# Patient Record
Sex: Female | Born: 1960
Health system: Southern US, Community
[De-identification: ages and names within clinical notes are randomized; demographics above are authoritative.]

## PROBLEM LIST (undated history)

## (undated) DIAGNOSIS — E119 Type 2 diabetes mellitus without complications: Secondary | ICD-10-CM

## (undated) DIAGNOSIS — Z9861 Coronary angioplasty status: Secondary | ICD-10-CM

## (undated) DIAGNOSIS — I2510 Atherosclerotic heart disease of native coronary artery without angina pectoris: Secondary | ICD-10-CM

## (undated) DIAGNOSIS — F319 Bipolar disorder, unspecified: Secondary | ICD-10-CM

## (undated) DIAGNOSIS — C801 Malignant (primary) neoplasm, unspecified: Secondary | ICD-10-CM

## (undated) DIAGNOSIS — I214 Non-ST elevation (NSTEMI) myocardial infarction: Secondary | ICD-10-CM

## (undated) DIAGNOSIS — E781 Pure hyperglyceridemia: Secondary | ICD-10-CM

## (undated) HISTORY — DX: Non-ST elevation (NSTEMI) myocardial infarction: I21.4

## (undated) HISTORY — DX: Coronary angioplasty status: Z98.61

## (undated) HISTORY — DX: Atherosclerotic heart disease of native coronary artery without angina pectoris: I25.10

## (undated) HISTORY — PX: ABDOMINAL HYSTERECTOMY: SHX81

---

## 1999-11-24 ENCOUNTER — Emergency Department (HOSPITAL_COMMUNITY): Admission: EM | Admit: 1999-11-24 | Discharge: 1999-11-24 | Payer: Self-pay

## 2015-08-08 ENCOUNTER — Encounter (HOSPITAL_COMMUNITY): Payer: Self-pay | Admitting: *Deleted

## 2015-08-08 ENCOUNTER — Emergency Department (HOSPITAL_COMMUNITY)
Admission: EM | Admit: 2015-08-08 | Discharge: 2015-08-08 | Disposition: A | Discharge status: Home / Self Care | Payer: Self-pay | Attending: Emergency Medicine | Admitting: Emergency Medicine

## 2015-08-08 DIAGNOSIS — Z8659 Personal history of other mental and behavioral disorders: Secondary | ICD-10-CM | POA: Insufficient documentation

## 2015-08-08 DIAGNOSIS — Z8541 Personal history of malignant neoplasm of cervix uteri: Secondary | ICD-10-CM | POA: Insufficient documentation

## 2015-08-08 DIAGNOSIS — Z720 Tobacco use: Secondary | ICD-10-CM | POA: Insufficient documentation

## 2015-08-08 DIAGNOSIS — E119 Type 2 diabetes mellitus without complications: Secondary | ICD-10-CM | POA: Insufficient documentation

## 2015-08-08 DIAGNOSIS — M79601 Pain in right arm: Secondary | ICD-10-CM | POA: Insufficient documentation

## 2015-08-08 DIAGNOSIS — M1990 Unspecified osteoarthritis, unspecified site: Secondary | ICD-10-CM | POA: Insufficient documentation

## 2015-08-08 HISTORY — DX: Type 2 diabetes mellitus without complications: E11.9

## 2015-08-08 HISTORY — DX: Malignant (primary) neoplasm, unspecified: C80.1

## 2015-08-08 HISTORY — DX: Bipolar disorder, unspecified: F31.9

## 2015-08-08 NOTE — ED Notes (Signed)
Pt placed into gown and on monitor. Pt remains monitored by blood pressure and pulse ox. pts family remains at bedside.

## 2015-08-08 NOTE — ED Notes (Signed)
Patient states for "a while" she has had right leg pain with weakness. States that today it has been worse associated with right knee pain. Patient states she can not stand for long periods of time and it is difficult to drive. Pt also reports discomfort to her hips. No injury.

## 2015-08-08 NOTE — Progress Notes (Signed)
CM spoke with pt who confirms self pay [facility] resident with no pcp.  CM discussed and provided written information for self pay pcps, discussed the importance of pcp vs EDP services for f/u care, www.needymeds.org, www.goodrx.com, discounted pharmacies and other [employer] such as [employer], affordable care act,  [place] med assist, financial assistance, self pay dental services, [place] med assist, DSS and  health department  Reviewed resources for [employer] self pay pcps like [facility], family medicine at [employer], [facility], [facility], local urgent care centers, [facility], [facility] family practice, general medical clinics, family services of the [place], [facility] urgent care plus others, medication resources, [facility] out patient pharmacies and housing Pt voiced understanding and appreciation of resources provided   Provided [facility] contact information Pt agreed to a referral Cm completed referral Pt to be contact by [facility] clinical liaison

## 2015-08-08 NOTE — Discharge Instructions (Signed)
Arthritis, Nonspecific Arthritis is pain, redness, warmth, or puffiness (inflammation) of a joint. The joint may be stiff or hurt when you move it. One or more joints may be affected. There are many types of arthritis. Your doctor may not know what type you have right away. The most common cause of arthritis is wear and tear on the joint (osteoarthritis). HOME CARE   Only take medicine as told by your doctor.  Rest the joint as much as possible.  Raise (elevate) your joint if it is puffy.  Use crutches if the painful joint is in your leg.  Drink enough fluids to keep your pee (urine) clear or pale yellow.  Follow your doctor's diet instructions.  Use cold packs for very bad joint pain for 10 to 15 minutes every hour. Ask your doctor if it is okay for you to use hot packs.  Exercise as told by your doctor.  Take a warm shower if you have stiffness in the morning.  Move your sore joints throughout the day. GET HELP RIGHT AWAY IF:   You have a fever.  You have very bad joint pain, puffiness, or redness.  You have many joints that are painful and puffy.  You are not getting better with treatment.  You have very bad back pain or leg weakness.  You cannot control when you poop (bowel movement) or pee (urinate).  You do not feel better in 24 hours or are getting worse.  You are having side effects from your medicine. MAKE SURE YOU:   Understand these instructions.  Will watch your condition.  Will get help right away if you are not doing well or get worse. Document Released: 02/28/2010 Document Revised: 06/04/2012 Document Reviewed: 02/28/2010 [facility] Patient Information 2015 [place], [place]. This information is not intended to replace advice given to you by your health care provider. Make sure you discuss any questions you have with your health care provider.  [facility] for further evaluation and management. Please use Tylenol or ibuprofen  as needed for pain. Please reattach information. Please return immediately if new worsening signs or symptoms present.

## 2015-08-08 NOTE — ED Notes (Signed)
Pt ambulatory w/ steady gait using walker to restroom.

## 2015-08-08 NOTE — ED Provider Notes (Signed)
CSN: [id number]     Arrival date & time 08/08/15  1102 History   First MD Initiated Contact with Patient 08/08/15 1126     Chief Complaint  Patient presents with  . Arm Pain  . Leg Pain  . Knee Pain    HPI   54 year old female presents today with numerous complaints. Patient reports that over the last 6 months she's developed right sided hip pain that radiates into the groin with walking. She reports additionally that the right knee has been bothering her, sharp pains worse with walking. She notes that she started using a cane due to the pain and now has developed left-sided leg pain similar to the right. She reports the left-sided leg pain mix it difficult for her to walk on that leg and has to transfer weight to the right causing her to fall. She reports several episodes of falling over the last month, denies any injuries from the fall. Patient reports weakness in the right leg due to pain. Patient also reports right shoulder pain, this is been chronic in nature for several years, reports that she had right shoulder pain that was evaluated by her primary care provider, treated symptomatically with good improvement.  Past Medical History  Diagnosis Date  . Diabetes mellitus without complication   . Cancer     uterus  . Bipolar disorder    Past Surgical History  Procedure Laterality Date  . Abdominal hysterectomy     History reviewed. No pertinent family history. Social History  Substance Use Topics  . Smoking status: Current Every Day Smoker  . Smokeless tobacco: None  . Alcohol Use: No   OB History    No data available     Review of Systems  All other systems reviewed and are negative.   Allergies  Review of patient's allergies indicates not on file.  Home Medications   Prior to Admission medications   Not on File   BP 132/69 mmHg  Pulse 78  Temp(Src) 98.1 F (36.7 C) (Oral)  Resp 16  Ht 5\' 1"  (1.549 m)  Wt 136 lb (61.689 kg)  BMI 25.71 kg/m2  SpO2 95%    Physical Exam  Constitutional: She is oriented to person, place, and time. She appears well-developed and well-nourished.  HENT:  Head: Normocephalic and atraumatic.  Eyes: Conjunctivae are normal. Pupils are equal, round, and reactive to light. Right eye exhibits no discharge. Left eye exhibits no discharge. No scleral icterus.  Neck: Normal range of motion. No JVD present. No tracheal deviation present.  Cardiovascular: Normal rate, regular rhythm, normal heart sounds and intact distal pulses.  Exam reveals no gallop and no friction rub.   No murmur heard. Pulmonary/Chest: Effort normal and breath sounds normal. No stridor. No respiratory distress. She has no wheezes. She has no rales. She exhibits no tenderness.  Abdominal: Soft. Bowel sounds are normal.  Neurological: She is alert and oriented to person, place, and time. Coordination normal.  Psychiatric: She has a normal mood and affect. Her behavior is normal. Judgment and thought content normal.  Nursing note and vitals reviewed.   ED Course  Procedures (including critical care time) Labs Review Labs Reviewed - No data to display  Imaging Review No results found. I have personally reviewed and evaluated these images and lab results as part of my medical decision-making.   EKG Interpretation None      MDM   Final diagnoses:  Arthritis    Labs:  Imaging:  Consults:  Therapeutics:  Discharge Meds:   Assessment/Plan: 54 year old female presents with arthritis. Patient has had symptoms for greater than 6 months, worsening. Patient uses a cane to walk at this time. When asked why patient was here for today's visit, she became emotional and reported that her daughter was going through significant issues with her husband and this caused her anxiety and wanted to be evaluated. During my exam patient was able to move around in the bed planting her heels lifting her hips up moving in all directions with normal strength of  her distal extremities. When specifically asked to do a straight leg raise she had difficulty, I believe this is due to effort. When again asked to perform various tasks she was able to do that. Neurological exam intact, no need for further evaluation or management here in the ED setting. Patient will be given symptomatic care advise, encouraged [facility] and management of her ongoing chronic condition.         Okey Regal, PA-C 08/10/15 0127  Wandra Arthurs, MD 08/13/15 [phone]

## 2016-03-18 DIAGNOSIS — I214 Non-ST elevation (NSTEMI) myocardial infarction: Secondary | ICD-10-CM

## 2016-03-18 HISTORY — PX: TRANSTHORACIC ECHOCARDIOGRAM: SHX275

## 2016-03-18 HISTORY — DX: Non-ST elevation (NSTEMI) myocardial infarction: I21.4

## 2016-04-06 DIAGNOSIS — I2510 Atherosclerotic heart disease of native coronary artery without angina pectoris: Secondary | ICD-10-CM

## 2016-04-06 HISTORY — DX: Atherosclerotic heart disease of native coronary artery without angina pectoris: I25.10

## 2016-04-12 ENCOUNTER — Emergency Department (HOSPITAL_COMMUNITY): Payer: BLUE CROSS/BLUE SHIELD

## 2016-04-12 ENCOUNTER — Ambulatory Visit (INDEPENDENT_AMBULATORY_CARE_PROVIDER_SITE_OTHER): Payer: Self-pay | Admitting: Physician Assistant

## 2016-04-12 ENCOUNTER — Inpatient Hospital Stay (HOSPITAL_COMMUNITY)
Admission: EM | Admit: 2016-04-12 | Discharge: 2016-04-16 | DRG: 246 | Disposition: A | Discharge status: Home / Self Care | Payer: BLUE CROSS/BLUE SHIELD | Attending: Internal Medicine | Admitting: Internal Medicine

## 2016-04-12 VITALS — BP 130/62 | HR 71 | Temp 98.0°F | Resp 16 | Ht 64.0 in | Wt 136.0 lb

## 2016-04-12 DIAGNOSIS — Z794 Long term (current) use of insulin: Secondary | ICD-10-CM

## 2016-04-12 DIAGNOSIS — R0789 Other chest pain: Secondary | ICD-10-CM

## 2016-04-12 DIAGNOSIS — Z9119 Patient's noncompliance with other medical treatment and regimen: Secondary | ICD-10-CM

## 2016-04-12 DIAGNOSIS — Z8542 Personal history of malignant neoplasm of other parts of uterus: Secondary | ICD-10-CM

## 2016-04-12 DIAGNOSIS — F17210 Nicotine dependence, cigarettes, uncomplicated: Secondary | ICD-10-CM | POA: Diagnosis present

## 2016-04-12 DIAGNOSIS — R079 Chest pain, unspecified: Secondary | ICD-10-CM | POA: Diagnosis not present

## 2016-04-12 DIAGNOSIS — Z91199 Patient's noncompliance with other medical treatment and regimen due to unspecified reason: Secondary | ICD-10-CM

## 2016-04-12 DIAGNOSIS — Z7982 Long term (current) use of aspirin: Secondary | ICD-10-CM

## 2016-04-12 DIAGNOSIS — E781 Pure hyperglyceridemia: Secondary | ICD-10-CM | POA: Diagnosis present

## 2016-04-12 DIAGNOSIS — E785 Hyperlipidemia, unspecified: Secondary | ICD-10-CM | POA: Diagnosis present

## 2016-04-12 DIAGNOSIS — E876 Hypokalemia: Secondary | ICD-10-CM | POA: Diagnosis present

## 2016-04-12 DIAGNOSIS — Z87891 Personal history of nicotine dependence: Secondary | ICD-10-CM

## 2016-04-12 DIAGNOSIS — Z9114 Patient's other noncompliance with medication regimen: Secondary | ICD-10-CM

## 2016-04-12 DIAGNOSIS — I214 Non-ST elevation (NSTEMI) myocardial infarction: Principal | ICD-10-CM

## 2016-04-12 DIAGNOSIS — Z79899 Other long term (current) drug therapy: Secondary | ICD-10-CM

## 2016-04-12 DIAGNOSIS — R072 Precordial pain: Secondary | ICD-10-CM | POA: Insufficient documentation

## 2016-04-12 DIAGNOSIS — F319 Bipolar disorder, unspecified: Secondary | ICD-10-CM

## 2016-04-12 DIAGNOSIS — M1990 Unspecified osteoarthritis, unspecified site: Secondary | ICD-10-CM | POA: Diagnosis present

## 2016-04-12 DIAGNOSIS — I2510 Atherosclerotic heart disease of native coronary artery without angina pectoris: Secondary | ICD-10-CM | POA: Diagnosis present

## 2016-04-12 DIAGNOSIS — E039 Hypothyroidism, unspecified: Secondary | ICD-10-CM | POA: Diagnosis present

## 2016-04-12 DIAGNOSIS — E1142 Type 2 diabetes mellitus with diabetic polyneuropathy: Secondary | ICD-10-CM | POA: Diagnosis present

## 2016-04-12 DIAGNOSIS — E1165 Type 2 diabetes mellitus with hyperglycemia: Secondary | ICD-10-CM | POA: Diagnosis present

## 2016-04-12 DIAGNOSIS — E119 Type 2 diabetes mellitus without complications: Secondary | ICD-10-CM

## 2016-04-12 DIAGNOSIS — Z720 Tobacco use: Secondary | ICD-10-CM | POA: Diagnosis not present

## 2016-04-12 DIAGNOSIS — E7800 Pure hypercholesterolemia, unspecified: Secondary | ICD-10-CM | POA: Diagnosis present

## 2016-04-12 DIAGNOSIS — Z8249 Family history of ischemic heart disease and other diseases of the circulatory system: Secondary | ICD-10-CM

## 2016-04-12 HISTORY — DX: Pure hyperglyceridemia: E78.1

## 2016-04-12 LAB — CBC WITH DIFFERENTIAL/PLATELET
Basophils Absolute: 0 10*3/uL (ref 0.0–0.1)
Basophils Relative: 0 %
EOS ABS: 0.2 10*3/uL (ref 0.0–0.7)
EOS PCT: 2 %
HCT: 37.2 % (ref 36.0–46.0)
Hemoglobin: 12.6 g/dL (ref 12.0–15.0)
LYMPHS ABS: 4 10*3/uL (ref 0.7–4.0)
LYMPHS PCT: 42 %
MCH: 32.5 pg (ref 26.0–34.0)
MCHC: 33.9 g/dL (ref 30.0–36.0)
MCV: 95.9 fL (ref 78.0–100.0)
MONO ABS: 0.7 10*3/uL (ref 0.1–1.0)
MONOS PCT: 8 %
Neutro Abs: 4.7 10*3/uL (ref 1.7–7.7)
Neutrophils Relative %: 48 %
PLATELETS: 302 10*3/uL (ref 150–400)
RBC: 3.88 MIL/uL (ref 3.87–5.11)
RDW: 12.6 % (ref 11.5–15.5)
WBC: 9.6 10*3/uL (ref 4.0–10.5)

## 2016-04-12 LAB — CBG MONITORING, ED: Glucose-Capillary: 314 mg/dL — ABNORMAL HIGH (ref 65–99)

## 2016-04-12 LAB — BASIC METABOLIC PANEL
ANION GAP: 10 (ref 5–15)
BUN: 10 mg/dL (ref 6–20)
CALCIUM: 9.3 mg/dL (ref 8.9–10.3)
CO2: 24 mmol/L (ref 22–32)
CREATININE: 0.63 mg/dL (ref 0.44–1.00)
Chloride: 102 mmol/L (ref 101–111)
GFR calc non Af Amer: >60 mL/min (ref >60)
Glucose, Bld: 362 mg/dL — ABNORMAL HIGH (ref 65–99)
Potassium: 3.8 mmol/L (ref 3.5–5.1)
SODIUM: 136 mmol/L (ref 135–145)

## 2016-04-12 LAB — I-STAT TROPONIN, ED: TROPONIN I, POC: 0.02 ng/mL (ref 0.00–0.08)

## 2016-04-12 MED ORDER — INSULIN ASPART 100 UNIT/ML ~~LOC~~ SOLN
0.0000 [IU] | Freq: Every day | SUBCUTANEOUS | Status: DC
  Administered 2016-04-13: 4 [IU] via SUBCUTANEOUS
  Administered 2016-04-13: 3 [IU] via SUBCUTANEOUS
  Administered 2016-04-14 – 2016-04-15 (×2): 2 [IU] via SUBCUTANEOUS
  Filled 2016-04-12: qty 1

## 2016-04-12 MED ORDER — ASPIRIN 81 MG PO CHEW
324.0000 mg | CHEWABLE_TABLET | Freq: Once | ORAL | Status: AC
  Administered 2016-04-12: 324 mg via ORAL

## 2016-04-12 NOTE — ED Notes (Signed)
Per EMS- pt began having a discomfort in her chest and was diaphoretic at approx 1pm. Pt states that the discomfort resolved at 3pm. Pt went to [facility] and received 325mg  of Asprin. Pt was also noted to have CBG greater than 400. BP 151/90. HR 82. 95%RA.

## 2016-04-12 NOTE — ED Provider Notes (Signed)
CSN: [id number]     Arrival date & time 04/12/16  1904 History   First MD Initiated Contact with Patient 04/12/16 1923     Chief Complaint  Patient presents with  . Chest Pain     (Consider location/radiation/quality/duration/timing/severity/associated sxs/prior Treatment) HPI 55 y.o. female with a history of diabetes, noncompliant with prescribed medications presents to the emergency department after she reportedly had the onset of central chest pressure radiating diffusely across her chest and associated with lightheadedness and diaphoresis around 1:40 PM this afternoon. The patient states that her symptoms started when she was driving and seemed to be associated with some emotional stress that she has been under recently. She states that her symptoms were constant but waxes and wanes slightly and seemed to worsen when climbing stairs up to her apartment. Rest seemed to improve her symptoms. Her symptoms lasted until about 3 PM. There was raised by the patient for cardiac etiology of her symptoms so she presented to an urgent care clinic for further evaluation. On arrival there she was given 325 mg aspirin, found to be hyperglycemic with stable vital signs and no further chest pain and was transferred to the ED for further evaluation. On arrival the patient denies any further chest pain. She denies any known history of CAD. She notes a significant family history of CAD but denies any history of early heart disease. She states that she has not been taking her prescribed metformin and statin has been taking intermittent herbal remedies. She has never had a stress test. She denies any associated leg swelling, SOB, pleuritic pain, or Hx of DVT/PE.   Past Medical History  Diagnosis Date  . Diabetes mellitus without complication ([place])   . Cancer ([place])     uterus  . Bipolar disorder [facility])    Past Surgical History  Procedure Laterality Date  . Abdominal hysterectomy     No family history on  file. Social History  Substance Use Topics  . Smoking status: Current Every Day Smoker  . Smokeless tobacco: Never Used  . Alcohol Use: No   OB History    No data available     Review of Systems  Constitutional: Positive for diaphoresis and activity change. Negative for fever and chills.  HENT: Negative for congestion, rhinorrhea and sinus pressure.   Respiratory: Positive for chest tightness. Negative for cough and shortness of breath.   Cardiovascular: Positive for chest pain. Negative for palpitations and leg swelling.  Gastrointestinal: Negative for nausea, vomiting and abdominal pain.  Musculoskeletal: Positive for neck pain (radiates up to neck slightly). Negative for back pain.  Skin: Negative for rash and wound.  Neurological: Positive for light-headedness. Negative for dizziness, syncope, weakness, numbness and headaches.  All other systems reviewed and are negative.     Allergies  Review of patient's allergies indicates no known allergies.  Home Medications   Prior to Admission medications   Not on File   BP 157/72 mmHg  Pulse 78  Temp(Src) 98.3 F (36.8 C) (Oral)  Resp 19  SpO2 95% Physical Exam  Constitutional: She is oriented to person, place, and time. She appears well-developed and well-nourished. No distress.  HENT:  Head: Normocephalic and atraumatic.  Nose: Nose normal.  Mouth/Throat: Oropharynx is clear and moist.  Eyes: Conjunctivae and EOM are normal. Pupils are equal, round, and reactive to light.  Neck: Neck supple.  Cardiovascular: Normal rate, regular rhythm, normal heart sounds and intact distal pulses.   Pulmonary/Chest: Effort normal and breath sounds  normal. She exhibits no tenderness.  Abdominal: Soft. She exhibits no distension. There is no tenderness.  Musculoskeletal: She exhibits no edema or tenderness.  Neurological: She is alert and oriented to person, place, and time. No cranial nerve deficit. Coordination normal.  Skin: Skin is  warm and dry. She is not diaphoretic.  Nursing note and vitals reviewed.   ED Course  Procedures (including critical care time) Labs Review Labs Reviewed  BASIC METABOLIC PANEL - Abnormal; Notable for the following:    Glucose, Bld 362 (*)    All other components within normal limits  CBG MONITORING, ED - Abnormal; Notable for the following:    Glucose-Capillary 314 (*)    All other components within normal limits  CBC WITH DIFFERENTIAL/PLATELET  Randolm Idol, ED    Imaging Review Dg Chest 2 View  04/12/2016  CLINICAL DATA:  Chest pain EXAM: CHEST  2 VIEW COMPARISON:  None. FINDINGS: Normal heart size and mediastinal contours. No acute infiltrate or edema. Calcified granuloma in the posterior right costophrenic sulcus. No effusion or pneumothorax. Calcific tendinitis of the right rotator cuff. IMPRESSION: No active cardiopulmonary disease. Calcific tendinitis right rotator cuff. Electronically Signed   By: Monte Fantasia M.D.   On: 04/12/2016 21:10   I have personally reviewed and evaluated these images and lab results as part of my medical decision-making.   EKG Interpretation   Date/Time:  Wednesday April 12 2016 19:13:19 EDT Ventricular Rate:  75 PR Interval:  160 QRS Duration: 85 QT Interval:  400 QTC Calculation: 447 R Axis:   119 Text Interpretation:  Sinus rhythm Right axis deviation Borderline repol  abnormality, diffuse leads Confirmed by Jeneen Rinks  MD, [place] ([id number]) on  04/12/2016 8:00:33 PM      MDM  55 y.o. female with a hx of DM noncompliant with rx'd meds presents to the ED noting the acute onset of central chest pressure, light headedness and diaphoresis this afternoon. Sx seemed to worsen with exertion and improve with rest. ASA at [facility] clinic prior to arrival here. She has had no further chest pain here. Physical exam reassuring, as above. EKG shows NSR with no clear acute ischemic changes.  Labs returned reassuringly with negative troponin, hyperglycemia at 314  consistent with known unmedicated diabetic, but normal AG and HCO3. Normal electrolytes.  Given significant chest pain that seemed to be exertional in nature with no hx of prior stress testing the decision was made to admit to the hospitalist service for cardiac obs. This plan was discussed with the patient at the bedside and she stated both understanding and agreement with this plan.   Final diagnoses:  Precordial pain        Zenovia Jarred, DO 04/12/16 2328  Tanna Furry, MD 04/22/16 2108

## 2016-04-12 NOTE — Progress Notes (Signed)
04/12/2016 6:28 PM   DOB: [birth year] / MRN: [id number]  SUBJECTIVE:  Abigail [name] is a 55 y.o. female current smoker with a ten pack year history, a history of poorly controlled type 2 diabetes and early CAD in her mother presenting for 2 episodes of chest and dizziness, both of which occurred today.  Reports she was driving at the time of the first episode and felt that she could not breath and also felt weak and faint.  She describes the pain as a tightness and is not experiencing any pain at this time.  There is a paucity of data in [facility] and appears that she has not been receiving any routine care.   She has No Known Allergies.   She  has a past medical history of Diabetes mellitus without complication ([place]); Cancer [facility]); and Bipolar disorder ([place]).    She  reports that she has been smoking.  She has never used smokeless tobacco. She reports that she does not drink alcohol or use illicit drugs. She  has no sexual activity history on file. The patient  has past surgical history that includes Abdominal hysterectomy.  Her family history is not on file.  Review of Systems  Respiratory: Negative for cough.   Cardiovascular: Positive for chest pain. Negative for palpitations and leg swelling.  Gastrointestinal: Negative for nausea.  Skin: Negative for rash.  Neurological: Negative for headaches.    Problem list and medications reviewed and updated by myself where necessary, and exist elsewhere in the encounter.   OBJECTIVE:  BP 130/62 mmHg  Pulse 71  Temp(Src) 98 F (36.7 C) (Oral)  Resp 16  Ht 5\' 4"  (1.626 m)  Wt 136 lb (61.689 kg)  BMI 23.33 kg/m2  SpO2 98%  Physical Exam  Constitutional: She is oriented to person, place, and time. She appears well-developed and well-nourished. No distress.  Cardiovascular: Normal rate, regular rhythm, normal heart sounds and normal pulses.   No extrasystoles are present.  Pulmonary/Chest: Breath sounds normal. No respiratory distress.  She has no wheezes. She has no rales. She exhibits no tenderness.  Abdominal: Soft. Bowel sounds are normal.  Musculoskeletal: Normal range of motion.  Neurological: She is alert and oriented to person, place, and time. No cranial nerve deficit.  Skin: She is not diaphoretic. There is pallor.    No results found for this or any previous visit (from the past 72 hour(s)).  No results found.  ASSESSMENT AND PLAN  [name] was seen today for chest pain, fatigue and dizziness.  Diagnoses and all orders for this visit:  Other chest pain: 55 year old female here today with two episodes of chest pain, both of which occurred today.  She relates a history of poorly controlled diabetes, is a current smoker, and she has a family history of early heart disease in her mother who was in her early late 67s-early 75s when she had an MI.  She relates the pain as a tightness and mild to moderate.  She associates feeling faint, mildly diaphoretic and weak. Will bypass work up here and send to to [facility] for cardiac rule out. He EKG is normal here however does not preclude a cardiac etiology and she has strong risk factors for CAD.  -     EKG 12-Lead -     aspirin chewable tablet 324 mg; Chew 4 tablets (324 mg total) by mouth once.    The patient was advised to call or return to clinic if she does not  see an improvement in symptoms or to seek the care of the closest emergency department if she worsens with the above plan.   Philis Fendt, MHS, PA-C [facility] 04/12/2016 6:28 PM

## 2016-04-12 NOTE — Patient Instructions (Signed)
     IF you received an x-ray today, you will receive an invoice from [facility]. Please contact [facility] at [phone] with questions or concerns regarding your invoice.   IF you received labwork today, you will receive an invoice from [facility]. Please contact [facility] at [phone] with questions or concerns regarding your invoice.   Our billing staff will not be able to assist you with questions regarding bills from these companies.  You will be contacted with the lab results as soon as they are available. The fastest way to get your results is to activate your My Chart account. Instructions are located on the last page of this paperwork. If you have not heard from us regarding the results in 2 weeks, please contact this office.      

## 2016-04-12 NOTE — H&P (Signed)
PCP:   No PCP Per Patient   Chief Complaint:  Chest pains  HPI: This is a 55 year old female with history of diabetes untreated for over a year. She states she's been treating it with herbal medications. Today while driving home she developed left-sided chest pains, at its worst it was 7/10, currently 0/10. Pain was described as heavy and continuous for approximately one hour. There is no radiation, it was all across her torso. She denies any palpitation but states she was diaphoretic. She denies GERD. She states she's never had chest pains before. She has never had a stress test before. She states it became worse while climbing up the stairs to her apartment and improved with rest. She finally came to ER. History provided by the patient.  Review of Systems:  The patient denies anorexia, fever, weight loss,, vision loss, decreased hearing, hoarseness, chest pain, syncope, dyspnea on exertion, peripheral edema, balance deficits, hemoptysis, abdominal pain, melena, hematochezia, severe indigestion/heartburn, hematuria, incontinence, genital sores, muscle weakness, suspicious skin lesions, transient blindness, difficulty walking, depression, unusual weight change, abnormal bleeding, enlarged lymph nodes, angioedema, and breast masses.  Past Medical History: Past Medical History  Diagnosis Date  . Diabetes mellitus without complication ([place])   . Cancer ([place])     uterus  . Bipolar disorder [facility])    Past Surgical History  Procedure Laterality Date  . Abdominal hysterectomy      Medications: Prior to Admission medications   Not on File  None  Allergies:  No Known Allergies  Social History:  reports that she has been smoking.  She has never used smokeless tobacco. She reports that she does not drink alcohol or use illicit drugs.  Family History: Coronary artery disease  Physical Exam: Filed Vitals:   04/12/16 1911 04/12/16 1912 04/12/16 1915  BP: 155/82  157/72  Pulse: 67 72 78   Temp:  98.3 F (36.8 C)   TempSrc:  Oral   Resp: 18 10 19   SpO2: 97% 97% 95%    General:  Alert and oriented times three, well developed and nourished, no acute distress Eyes: PERRLA, pink conjunctiva, no scleral icterus ENT: Moist oral mucosa, neck supple, no thyromegaly Lungs: clear to ascultation, no wheeze, no crackles, no use of accessory muscles Cardiovascular: regular rate and rhythm, no regurgitation, no gallops, no murmurs. No carotid bruits, no JVD Abdomen: soft, positive BS, non-tender, non-distended, no organomegaly, not an acute abdomen GU: not examined Neuro: CN II - XII grossly intact, sensation intact Musculoskeletal: strength 5/5 all extremities, no clubbing, cyanosis or edema Skin: no rash, no subcutaneous crepitation, no decubitus Psych: appropriate patient   Labs on Admission:   Recent Labs  04/12/16 2017  NA 136  K 3.8  CL 102  CO2 24  GLUCOSE 362*  BUN 10  CREATININE 0.63  CALCIUM 9.3   No results for input(s): AST, ALT, ALKPHOS, BILITOT, PROT, ALBUMIN in the last 72 hours. No results for input(s): LIPASE, AMYLASE in the last 72 hours.  Recent Labs  04/12/16 2017  WBC 9.6  NEUTROABS 4.7  HGB 12.6  HCT 37.2  MCV 95.9  PLT 302   No results for input(s): CKTOTAL, CKMB, CKMBINDEX, TROPONINI in the last 72 hours. Invalid input(s): POCBNP No results for input(s): DDIMER in the last 72 hours. No results for input(s): HGBA1C in the last 72 hours. No results for input(s): CHOL, HDL, LDLCALC, TRIG, CHOLHDL, LDLDIRECT in the last 72 hours. No results for input(s): TSH, T4TOTAL, T3FREE, THYROIDAB in the  last 72 hours.  Invalid input(s): FREET3 No results for input(s): VITAMINB12, FOLATE, FERRITIN, TIBC, IRON, RETICCTPCT in the last 72 hours.  Micro Results: No results found for this or any previous visit (from the past 240 hour(s)).   Radiological Exams on Admission: Dg Chest 2 View  04/12/2016  CLINICAL DATA:  Chest pain EXAM: CHEST  2 VIEW  COMPARISON:  None. FINDINGS: Normal heart size and mediastinal contours. No acute infiltrate or edema. Calcified granuloma in the posterior right costophrenic sulcus. No effusion or pneumothorax. Calcific tendinitis of the right rotator cuff. IMPRESSION: No active cardiopulmonary disease. Calcific tendinitis right rotator cuff. Electronically Signed   By: Monte Fantasia M.D.   On: 04/12/2016 21:10   EKG: Normal sinus rhythm  Assessment/Plan Present on Admission:  . Chest pain -Bring in for 23 observation -Second cardiac enzyme -Lipid panel in a.m. -Aspirin daily -Stress test in a.m.  Diabetes mellitus uncontrolled -Patient declines restarting of metformin -ADA diet, sliding-scale insulin  Tobacco abuse -Nicotine patch  Bipolar disease -Aware. Patient states she no longer has this  Medical noncompliance -Patient prefers homeopathic medications   Abigail [name] 04/12/2016, 11:03 PM

## 2016-04-13 ENCOUNTER — Observation Stay (HOSPITAL_COMMUNITY): Payer: BLUE CROSS/BLUE SHIELD

## 2016-04-13 ENCOUNTER — Encounter (HOSPITAL_COMMUNITY): Payer: Self-pay | Admitting: Physician Assistant

## 2016-04-13 DIAGNOSIS — I2510 Atherosclerotic heart disease of native coronary artery without angina pectoris: Secondary | ICD-10-CM | POA: Diagnosis present

## 2016-04-13 DIAGNOSIS — E785 Hyperlipidemia, unspecified: Secondary | ICD-10-CM

## 2016-04-13 DIAGNOSIS — Z9114 Patient's other noncompliance with medication regimen: Secondary | ICD-10-CM | POA: Diagnosis not present

## 2016-04-13 DIAGNOSIS — Z8249 Family history of ischemic heart disease and other diseases of the circulatory system: Secondary | ICD-10-CM | POA: Diagnosis not present

## 2016-04-13 DIAGNOSIS — Z8542 Personal history of malignant neoplasm of other parts of uterus: Secondary | ICD-10-CM | POA: Diagnosis not present

## 2016-04-13 DIAGNOSIS — Z9119 Patient's noncompliance with other medical treatment and regimen: Secondary | ICD-10-CM

## 2016-04-13 DIAGNOSIS — F319 Bipolar disorder, unspecified: Secondary | ICD-10-CM | POA: Diagnosis present

## 2016-04-13 DIAGNOSIS — E118 Type 2 diabetes mellitus with unspecified complications: Secondary | ICD-10-CM

## 2016-04-13 DIAGNOSIS — E781 Pure hyperglyceridemia: Secondary | ICD-10-CM

## 2016-04-13 DIAGNOSIS — Z79899 Other long term (current) drug therapy: Secondary | ICD-10-CM | POA: Diagnosis not present

## 2016-04-13 DIAGNOSIS — E1165 Type 2 diabetes mellitus with hyperglycemia: Secondary | ICD-10-CM | POA: Diagnosis present

## 2016-04-13 DIAGNOSIS — E7800 Pure hypercholesterolemia, unspecified: Secondary | ICD-10-CM | POA: Diagnosis present

## 2016-04-13 DIAGNOSIS — M1990 Unspecified osteoarthritis, unspecified site: Secondary | ICD-10-CM | POA: Diagnosis present

## 2016-04-13 DIAGNOSIS — R079 Chest pain, unspecified: Secondary | ICD-10-CM | POA: Diagnosis present

## 2016-04-13 DIAGNOSIS — E1142 Type 2 diabetes mellitus with diabetic polyneuropathy: Secondary | ICD-10-CM | POA: Diagnosis present

## 2016-04-13 DIAGNOSIS — E876 Hypokalemia: Secondary | ICD-10-CM | POA: Diagnosis present

## 2016-04-13 DIAGNOSIS — R072 Precordial pain: Secondary | ICD-10-CM | POA: Diagnosis not present

## 2016-04-13 DIAGNOSIS — I214 Non-ST elevation (NSTEMI) myocardial infarction: Secondary | ICD-10-CM | POA: Diagnosis present

## 2016-04-13 DIAGNOSIS — Z720 Tobacco use: Secondary | ICD-10-CM

## 2016-04-13 DIAGNOSIS — E039 Hypothyroidism, unspecified: Secondary | ICD-10-CM | POA: Diagnosis present

## 2016-04-13 DIAGNOSIS — I209 Angina pectoris, unspecified: Secondary | ICD-10-CM | POA: Diagnosis not present

## 2016-04-13 DIAGNOSIS — F17210 Nicotine dependence, cigarettes, uncomplicated: Secondary | ICD-10-CM | POA: Diagnosis present

## 2016-04-13 DIAGNOSIS — R0789 Other chest pain: Secondary | ICD-10-CM | POA: Diagnosis not present

## 2016-04-13 DIAGNOSIS — Z7982 Long term (current) use of aspirin: Secondary | ICD-10-CM | POA: Diagnosis not present

## 2016-04-13 LAB — CBC
HCT: 40.2 % (ref 36.0–46.0)
HEMATOCRIT: 38.1 % (ref 36.0–46.0)
HEMOGLOBIN: 13.1 g/dL (ref 12.0–15.0)
HEMOGLOBIN: 13.8 g/dL (ref 12.0–15.0)
MCH: 33 pg (ref 26.0–34.0)
MCH: 33.4 pg (ref 26.0–34.0)
MCHC: 34.3 g/dL (ref 30.0–36.0)
MCHC: 34.4 g/dL (ref 30.0–36.0)
MCV: 96.2 fL (ref 78.0–100.0)
MCV: 97.2 fL (ref 78.0–100.0)
Platelets: 324 10*3/uL (ref 150–400)
Platelets: 294 10*3/uL (ref 150–400)
RBC: 3.92 MIL/uL (ref 3.87–5.11)
RBC: 4.18 MIL/uL (ref 3.87–5.11)
RDW: 12.6 % (ref 11.5–15.5)
RDW: 12.8 % (ref 11.5–15.5)
WBC: 8.1 10*3/uL (ref 4.0–10.5)
WBC: 9.7 10*3/uL (ref 4.0–10.5)

## 2016-04-13 LAB — GLUCOSE, CAPILLARY
Glucose-Capillary: 179 mg/dL — ABNORMAL HIGH (ref 65–99)
Glucose-Capillary: 224 mg/dL — ABNORMAL HIGH (ref 65–99)
Glucose-Capillary: 256 mg/dL — ABNORMAL HIGH (ref 65–99)
Glucose-Capillary: 278 mg/dL — ABNORMAL HIGH (ref 65–99)
Glucose-Capillary: 293 mg/dL — ABNORMAL HIGH (ref 65–99)

## 2016-04-13 LAB — T4, FREE: FREE T4: 0.36 ng/dL — AB (ref 0.61–1.12)

## 2016-04-13 LAB — BASIC METABOLIC PANEL
ANION GAP: 8 (ref 5–15)
BUN: 9 mg/dL (ref 6–20)
CHLORIDE: 106 mmol/L (ref 101–111)
CO2: 26 mmol/L (ref 22–32)
Calcium: 9.2 mg/dL (ref 8.9–10.3)
Creatinine, Ser: 0.62 mg/dL (ref 0.44–1.00)
GFR calc non Af Amer: >60 mL/min (ref >60)
Glucose, Bld: 340 mg/dL — ABNORMAL HIGH (ref 65–99)
POTASSIUM: 3.7 mmol/L (ref 3.5–5.1)
Sodium: 140 mmol/L (ref 135–145)

## 2016-04-13 LAB — LIPID PANEL
Cholesterol: 403 mg/dL — ABNORMAL HIGH (ref 0–200)
HDL: 42 mg/dL (ref >40)
LDL CALC: UNDETERMINED mg/dL (ref 0–99)
TRIGLYCERIDES: 526 mg/dL — AB (ref <150)
Total CHOL/HDL Ratio: 9.6 RATIO
VLDL: UNDETERMINED mg/dL (ref 0–40)

## 2016-04-13 LAB — TSH: TSH: 64.977 u[IU]/mL — ABNORMAL HIGH (ref 0.350–4.500)

## 2016-04-13 LAB — CREATININE, SERUM
CREATININE: 0.62 mg/dL (ref 0.44–1.00)
GFR calc Af Amer: >60 mL/min (ref >60)
GFR calc non Af Amer: >60 mL/min (ref >60)

## 2016-04-13 LAB — TROPONIN I
TROPONIN I: 0.35 ng/mL — AB (ref <0.031)
Troponin I: 0.1 ng/mL — ABNORMAL HIGH (ref <0.031)
Troponin I: 0.2 ng/mL — ABNORMAL HIGH (ref <0.031)

## 2016-04-13 LAB — HEPARIN LEVEL (UNFRACTIONATED): Heparin Unfractionated: 0.16 IU/mL — ABNORMAL LOW (ref 0.30–0.70)

## 2016-04-13 MED ORDER — NICOTINE 14 MG/24HR TD PT24
14.0000 mg | MEDICATED_PATCH | Freq: Every day | TRANSDERMAL | Status: DC
  Administered 2016-04-13 – 2016-04-16 (×3): 14 mg via TRANSDERMAL
  Filled 2016-04-13 (×3): qty 1

## 2016-04-13 MED ORDER — ACETAMINOPHEN 325 MG PO TABS: 650.0000 mg | ORAL_TABLET | Freq: Four times a day (QID) | ORAL | Status: DC | PRN

## 2016-04-13 MED ORDER — ASPIRIN 325 MG PO TABS
325.0000 mg | ORAL_TABLET | Freq: Every day | ORAL | Status: DC
  Administered 2016-04-13: 325 mg via ORAL
  Filled 2016-04-13: qty 1

## 2016-04-13 MED ORDER — LEVOTHYROXINE SODIUM 50 MCG PO TABS
50.0000 ug | ORAL_TABLET | Freq: Every day | ORAL | Status: DC
  Administered 2016-04-13 – 2016-04-16 (×4): 50 ug via ORAL
  Filled 2016-04-13 (×4): qty 1

## 2016-04-13 MED ORDER — INSULIN GLARGINE 100 UNIT/ML ~~LOC~~ SOLN
10.0000 [IU] | Freq: Every day | SUBCUTANEOUS | Status: DC
  Administered 2016-04-13 – 2016-04-15 (×3): 10 [IU] via SUBCUTANEOUS
  Filled 2016-04-13 (×4): qty 0.1

## 2016-04-13 MED ORDER — SODIUM CHLORIDE 0.9% FLUSH
3.0000 mL | Freq: Two times a day (BID) | INTRAVENOUS | Status: DC
  Administered 2016-04-13 – 2016-04-15 (×3): 3 mL via INTRAVENOUS

## 2016-04-13 MED ORDER — ACETAMINOPHEN 650 MG RE SUPP: 650.0000 mg | Freq: Four times a day (QID) | RECTAL | Status: DC | PRN

## 2016-04-13 MED ORDER — ENOXAPARIN SODIUM 40 MG/0.4ML ~~LOC~~ SOLN
40.0000 mg | SUBCUTANEOUS | Status: DC
Start: 2016-04-14 — End: 2016-04-13

## 2016-04-13 MED ORDER — ATORVASTATIN CALCIUM 20 MG PO TABS
20.0000 mg | ORAL_TABLET | Freq: Every day | ORAL | Status: DC
  Administered 2016-04-13: 20 mg via ORAL
  Filled 2016-04-13: qty 1

## 2016-04-13 MED ORDER — LIVING WELL WITH DIABETES BOOK
Freq: Once | Status: AC
  Administered 2016-04-13: 17:00:00
  Filled 2016-04-13: qty 1

## 2016-04-13 MED ORDER — INSULIN ASPART 100 UNIT/ML ~~LOC~~ SOLN
0.0000 [IU] | Freq: Three times a day (TID) | SUBCUTANEOUS | Status: DC
  Administered 2016-04-13 (×2): 8 [IU] via SUBCUTANEOUS
  Administered 2016-04-13: 3 [IU] via SUBCUTANEOUS
  Administered 2016-04-14: 5 [IU] via SUBCUTANEOUS
  Administered 2016-04-14 – 2016-04-15 (×2): 8 [IU] via SUBCUTANEOUS
  Administered 2016-04-15: 3 [IU] via SUBCUTANEOUS
  Administered 2016-04-15: 5 [IU] via SUBCUTANEOUS
  Administered 2016-04-16: 8 [IU] via SUBCUTANEOUS
  Administered 2016-04-16: 3 [IU] via SUBCUTANEOUS

## 2016-04-13 MED ORDER — HEPARIN (PORCINE) IN NACL 100-0.45 UNIT/ML-% IJ SOLN
900.0000 [IU]/h | INTRAMUSCULAR | Status: DC
  Administered 2016-04-13: 700 [IU]/h via INTRAVENOUS
  Filled 2016-04-13: qty 250

## 2016-04-13 MED ORDER — DOCUSATE SODIUM 100 MG PO CAPS
100.0000 mg | ORAL_CAPSULE | Freq: Two times a day (BID) | ORAL | Status: DC
  Administered 2016-04-13 – 2016-04-16 (×6): 100 mg via ORAL
  Filled 2016-04-13 (×7): qty 1

## 2016-04-13 MED ORDER — HEPARIN BOLUS VIA INFUSION
2000.0000 [IU] | Freq: Once | INTRAVENOUS | Status: AC
  Administered 2016-04-13: 2000 [IU] via INTRAVENOUS
  Filled 2016-04-13: qty 2000

## 2016-04-13 MED ORDER — FENOFIBRATE 160 MG PO TABS
160.0000 mg | ORAL_TABLET | Freq: Every day | ORAL | Status: DC
  Administered 2016-04-13 – 2016-04-16 (×3): 160 mg via ORAL
  Filled 2016-04-13 (×3): qty 1

## 2016-04-13 MED ORDER — ENOXAPARIN SODIUM 30 MG/0.3ML ~~LOC~~ SOLN
30.0000 mg | SUBCUTANEOUS | Status: DC
  Administered 2016-04-13: 30 mg via SUBCUTANEOUS
  Filled 2016-04-13: qty 0.3

## 2016-04-13 NOTE — Progress Notes (Signed)
Inpatient Diabetes Program Recommendations  AACE/ADA: New Consensus Statement on Inpatient Glycemic Control (2015)  Target Ranges:  Prepandial:   less than 140 mg/dL      Peak postprandial:   less than 180 mg/dL (1-2 hours)      Critically ill patients:  140 - 180 mg/dL   Review of Glycemic Control  Diabetes history: T2 Outpatient Diabetes medications: none noted in med rec Current orders for Inpatient glycemic control: Novolog mod scale  Inpatient Diabetes Program Recommendations:  Insulin - Basal: add Lantus 10 units  HgbA1C: pending  Thank you  Raoul Pitch BSN, RN,CDE Inpatient Diabetes Coordinator [phone] (team pager)

## 2016-04-13 NOTE — Progress Notes (Addendum)
Patient ID: Abigail [name], female   DOB: 1961-02-27, 55 y.o.   MRN: [id number]   PROGRESS NOTE    Corayma Mccrossen  [id number] DOB: 1961-02-07 DOA: 04/12/2016  PCP: No PCP Per Patient   Outpatient Specialists:   Brief Narrative:  55 year old female with history of diabetes untreated for over a year, presented for evaluation of left-sided chest pain.  Assessment & Plan:   Active Problems:   Chest pain - better this AM but troponins up, ? Unstable angina vs NSTEMI  - started aspirin, lipitor - cardiology consulted     Diabetes ([place]) - A1C pending - continue SSI for now - diabetic educator consulted     HLD - with cholesterol > 400 - start statin     Hypothyroidism - TSH > 64, order free T4, T3 - start synthroid 50 mcg PO QD - outpatient follow up     Bipolar 1 disorder ([place])    Tobacco abuse - continue Nicotine patch   DVT prophylaxis: Lovenox SQ Code Status: Full  Family Communication: Patient at bedside  Disposition Plan: 4/28  Consultants:   Cardiology   Procedures:   Stress test  Antimicrobials:  None   Subjective: No chest pain this AM  Objective: Filed Vitals:   04/12/16 2000 04/12/16 2335 04/13/16 0039 04/13/16 0513  BP: 155/72 137/66 144/50 118/62  Pulse: 69 68 74 64  Temp:  98.4 F (36.9 C) 98.4 F (36.9 C) 98.1 F (36.7 C)  TempSrc:  Oral Oral Oral  Resp: 16 13 18 18   Height:   5\' 4"  (1.626 m)   Weight:   56.609 kg (124 lb 12.8 oz)   SpO2: 96% 97% 98% 97%    Intake/Output Summary (Last 24 hours) at 04/13/16 0657 Last data filed at 04/13/16 0040  Gross per 24 hour  Intake      0 ml  Output    550 ml  Net   -550 ml   Filed Weights   04/13/16 0039  Weight: 56.609 kg (124 lb 12.8 oz)    Examination:  General exam: Appears calm and comfortable  Respiratory system: Clear to auscultation. Respiratory effort normal. Cardiovascular system: S1 & S2 heard, RRR. No JVD, murmurs, rubs, gallops or clicks. No pedal  edema. Gastrointestinal system: Abdomen is nondistended, soft and nontender. Central nervous system: Alert and oriented. No focal neurological deficits. Extremities: Symmetric 5 x 5 power. Skin: No rashes, lesions or ulcers Psychiatry: Judgement and insight appear normal. Mood & affect appropriate.   Data Reviewed: I have personally reviewed following labs and imaging studies  CBC:  Recent Labs Lab 04/12/16 2017 04/13/16 0055  WBC 9.6 9.7  NEUTROABS 4.7  --   HGB 12.6 13.8  HCT 37.2 40.2  MCV 95.9 96.2  PLT 302 0000000   Basic Metabolic Panel:  Recent Labs Lab 04/12/16 2017 04/13/16 0055  NA 136  --   K 3.8  --   CL 102  --   CO2 24  --   GLUCOSE 362*  --   BUN 10  --   CREATININE 0.63 0.62  CALCIUM 9.3  --    Cardiac Enzymes:  Recent Labs Lab 04/13/16 0055  TROPONINI 0.10*   CBG:  Recent Labs Lab 04/12/16 2101 04/13/16 0043  GLUCAP 314* 224*   Lipid Profile:  Recent Labs  04/13/16 0055  CHOL 403*  HDL 42  LDLCALC UNABLE TO CALCULATE IF TRIGLYCERIDE OVER 400 mg/dL  TRIG 526*  CHOLHDL 9.6   Thyroid Function Tests:  Recent Labs  04/13/16 0055  TSH 64.977*    Radiology Studies: Dg Chest 2 View 04/12/2016  No active cardiopulmonary disease. Calcific tendinitis right rotator cuff.   Scheduled Meds: . aspirin  325 mg Oral Daily  . docusate sodium  100 mg Oral BID  . enoxaparin (LOVENOX) injection  30 mg Subcutaneous Q24H  . insulin aspart  0-15 Units Subcutaneous TID WC  . insulin aspart  0-5 Units Subcutaneous QHS  . nicotine  14 mg Transdermal Daily  . sodium chloride flush  3 mL Intravenous Q12H   Continuous Infusions:  Time spent: 20 minutes   Faye Ramsay, MD [facility] Pager [phone]  If 7PM-7AM, please contact night-coverage [email or web address] Password [facility] 04/13/2016, 6:57 AM    \

## 2016-04-13 NOTE — Progress Notes (Signed)
ANTICOAGULATION CONSULT NOTE - Follow Up Consult  Pharmacy Consult for Heparin  Indication: chest pain/ACS  No Known Allergies  Patient Measurements: Height: 5\' 4"  (162.6 cm) Weight: 124 lb 12.8 oz (56.609 kg) (scale b) IBW/kg (Calculated) : 54.7  Vital Signs: Temp: 98 F (36.7 C) (04/27 2101) Temp Source: Oral (04/27 2101) BP: 135/63 mmHg (04/27 2101) Pulse Rate: 67 (04/27 2101)  Labs:  Recent Labs  04/12/16 2017 04/13/16 0055 04/13/16 0648 04/13/16 1234 04/13/16 2304  HGB 12.6 13.8 13.1  --   --   HCT 37.2 40.2 38.1  --   --   PLT 302 324 294  --   --   HEPARINUNFRC  --   --   --   --  0.16*  CREATININE 0.63 0.62 0.62  --   --   TROPONINI  --  0.10* 0.20* 0.35*  --     Estimated Creatinine Clearance: 70.2 mL/min (by C-G formula based on Cr of 0.62).    Assessment: Heparin for NSTEMI, initial heparin level is slightly low at 0.16, cath 4/28  Goal of Therapy:  Heparin level 0.3-0.7 units/ml Monitor platelets by anticoagulation protocol: Yes   Plan:  -Inc heparin to 900 units/hr -0800 HL  Narda Bonds 04/13/2016,11:48 PM

## 2016-04-13 NOTE — Progress Notes (Signed)
ANTICOAGULATION CONSULT NOTE - Initial Consult  Pharmacy Consult for Heparin Indication: chest pain/ACS  No Known Allergies  Patient Measurements: Height: 5\' 4"  (162.6 cm) Weight: 124 lb 12.8 oz (56.609 kg) (scale b) IBW/kg (Calculated) : 54.7 Heparin Dosing Weight: 56 kg  Vital Signs: Temp: 99 F (37.2 C) (04/27 1254) Temp Source: Oral (04/27 1254) BP: 112/61 mmHg (04/27 1254) Pulse Rate: 71 (04/27 1254)  Labs:  Recent Labs  04/12/16 2017 04/13/16 0055 04/13/16 0648 04/13/16 1234  HGB 12.6 13.8 13.1  --   HCT 37.2 40.2 38.1  --   PLT 302 324 294  --   CREATININE 0.63 0.62 0.62  --   TROPONINI  --  0.10* 0.20* 0.35*    Estimated Creatinine Clearance: 70.2 mL/min (by C-G formula based on Cr of 0.62).   Medical History: Past Medical History  Diagnosis Date  . Diabetes mellitus without complication ([place])   . Cancer ([place])     uterus  . Bipolar disorder ([place])   . Hypertriglyceridemia     Assessment: 33 YOF with chest pain, troponin elevation, to start IV heparin, likely for cath tomorrow. She received lovenox 30 mg today at 1023  Goal of Therapy:  Heparin level 0.3-0.7 units/ml Monitor platelets by anticoagulation protocol: Yes   Plan:  Heparin  Bolus 2000 units/hr Heparin infusion 700 units/hr F/u 6 hr heparin level at 2300 D/c lovenox  Maryanna Shape, PharmD, BCPS  Clinical Pharmacist  Pager: [phone]   04/13/2016,4:07 PM

## 2016-04-13 NOTE — Consult Note (Signed)
CARDIOLOGY CONSULT NOTE   Patient ID: Abigail [name] MRN: [id number] DOB/AGE: December 11, 1961 55 y.o.  Admit date: 04/12/2016  Requesting Physician: Dr. Doyle Askew Primary Physician:   No PCP Per Patient Primary Cardiologist:   None Reason for Consultation: NSTEMI  HPI: Abigail [name] is a 55 y.o. female with a history of uncontrolled DM, HLD, hypothyroidism, uterine cancer, tobacco abuse and bipolar disorder who presented to [facility] on [facility]/12/16 with chest pain and ruled in NSTEMI. Cardiology consulted.   She is originally from [place] but has lived in [place] for quite some time. She has 3 daughters, two of which live locally.   She has a history of DMT2 for which she has not treated with medications for over 1 year. She only uses herbal medications. Work up in the ED showed initial troponin negative. However, she ruled in over night. Troponin  0.1--> 0.2. Other labs notable for TC > 400 and TG >500. TSH 65.   Patient was in her usual state of health until yesterday while driving when she had a left-sided chest pressure that was associated with diaphoresis and lightheadedness. She denies shortness of breath, radiation or nausea. It went away and then returned about 40 minutes later while laying in bed. This prompted her to be seen in the emergency department. However, the chest pain had resolved by that time and has not recurred since. The patient is at baseline very immobile due to peripheral neuropathy and arthritis. She does live on the third floor of an apartment building and climbs steps every day with no problems. However, she has she has to do this extremely slowly.   She said that she had seen a primary care doctor in the past 6 months at [facility] However, her daughter pulled me aside in the hallway and told me that this was a lie. The patient has a long history of bipolar disorder but has recently has not had a bout of depression. The patient told me that her bipolar disorder has  resolved completely. She is currently not on medication for this. However, her daughter did bring up that compliance was going to be an issue.  Patient has a family history of CAD in her mother. Her first event was in her 32s. She also has a history of CAD in both her grandparents on her maternal and paternal side. She smokes a pack of cigarettes in 1-2 days and has been doing this for over 25 years.    Past Medical History  Diagnosis Date  . Diabetes mellitus without complication ([place])   . Cancer ([place])     uterus  . Bipolar disorder [facility])      Past Surgical History  Procedure Laterality Date  . Abdominal hysterectomy      No Known Allergies  I have reviewed the patient's current medications . aspirin  325 mg Oral Daily  . atorvastatin  20 mg Oral q1800  . docusate sodium  100 mg Oral BID  . enoxaparin (LOVENOX) injection  30 mg Subcutaneous Q24H  . insulin aspart  0-15 Units Subcutaneous TID WC  . insulin aspart  0-5 Units Subcutaneous QHS  . insulin glargine  10 Units Subcutaneous QHS  . levothyroxine  50 mcg Oral QAC breakfast  . living well with diabetes book   Does not apply Once  . nicotine  14 mg Transdermal Daily  . sodium chloride flush  3 mL Intravenous Q12H     acetaminophen **OR** acetaminophen  Prior to Admission medications  Not on File     Social History   Social History  . Marital Status: Divorced    Spouse Name: N/A  . Number of Children: N/A  . Years of Education: N/A   Occupational History  . Not on file.   Social History Main Topics  . Smoking status: Current Every Day Smoker  . Smokeless tobacco: Never Used  . Alcohol Use: No  . Drug Use: No  . Sexual Activity: Not on file   Other Topics Concern  . Not on file   Social History Narrative    No family status information on file.   No family history on file.   ROS:  Full 14 point review of systems complete and found to be negative unless listed above.  Physical Exam: Blood  pressure 112/61, pulse 71, temperature 99 F (37.2 C), temperature source Oral, resp. rate 18, height 5\' 4"  (1.626 m), weight 124 lb 12.8 oz (56.609 kg), SpO2 97 %.  General: Well developed, well nourished, female in no acute distress Head: Eyes PERRLA, No xanthomas.   Normocephalic and atraumatic, oropharynx without edema or exudate.  Lungs: CTAB  Heart: HRRR S1 S2, no rub/gallop, Heart regular rate and rhythm with S1, S2  murmur. pulses are 2+ extrem.   Neck: No carotid bruits. No lymphadenopathy.  No JVD. Abdomen: Bowel sounds present, abdomen soft and non-tender without masses or hernias noted. Msk:  No spine or cva tenderness. No weakness, no joint deformities or effusions. Extremities: No clubbing or cyanosis. No LE edema.  Neuro: Alert and oriented X 3. No focal deficits noted. Psych:  Good affect, responds appropriately Skin: No rashes or lesions noted.  Labs:   Lab Results  Component Value Date   WBC 8.1 04/13/2016   HGB 13.1 04/13/2016   HCT 38.1 04/13/2016   MCV 97.2 04/13/2016   PLT 294 04/13/2016   No results for input(s): INR in the last 72 hours.   Recent Labs Lab 04/13/16 0648  NA 140  K 3.7  CL 106  CO2 26  BUN 9  CREATININE 0.62  CALCIUM 9.2  GLUCOSE 340*   No results found for: MG  Recent Labs  04/13/16 0055 04/13/16 0648 04/13/16 1234  TROPONINI 0.10* 0.20* 0.35*    Recent Labs  04/12/16 2041  TROPIPOC 0.02   No results found for: PROBNP Lab Results  Component Value Date   CHOL 403* 04/13/2016   HDL 42 04/13/2016   LDLCALC UNABLE TO CALCULATE IF TRIGLYCERIDE OVER 400 mg/dL 04/13/2016   TRIG 526* 04/13/2016    TSH  Date/Time Value Ref Range Status  04/13/2016 12:55 AM 64.977* 0.350 - 4.500 uIU/mL Final    Echo: pending.  ECG:  HR 75. NSR no acute ST or TW changes  Radiology:  Dg Chest 2 View  04/12/2016  CLINICAL DATA:  Chest pain EXAM: CHEST  2 VIEW COMPARISON:  None. FINDINGS: Normal heart size and mediastinal contours. No  acute infiltrate or edema. Calcified granuloma in the posterior right costophrenic sulcus. No effusion or pneumothorax. Calcific tendinitis of the right rotator cuff. IMPRESSION: No active cardiopulmonary disease. Calcific tendinitis right rotator cuff. Electronically Signed   By: Monte Fantasia M.D.   On: 04/12/2016 21:10    ASSESSMENT AND PLAN:    Principal Problem:   NSTEMI (non-ST elevated myocardial infarction) ([place]) Active Problems:   Diabetes ([place])   Bipolar 1 disorder ([facility])   Tobacco abuse   History of uterine cancer   Noncompliance   Hypertriglyceridemia  Abigail [name] is a 55 y.o. female with a history of uncontrolled DM, HLD, hypothyroidism, uterine cancer, tobacco abuse and bipolar disorder who presented to [facility] on 04/12/16 with chest pain.   NSTEMI: Work up in the ED showed initial troponin negative. However, she ruled in over night. Troponin  0.1--> 0.2--> 0.35. Will start IV heparin.  She will need a LHC. NPO after midnight. If she requires stenting, may want to consider BMS as compliance with DAPT for 1 year may be difficult for her.   HLD: TC > 400 and TG >500. Started on a statin. If cath with significant CAD will need high dose statin  Hypertriglyceridemia: will start fenofibrate 160mg  daily.   Hypothyroidism: TSH 65. Started on synthroid   Uncontrolled DM: has taken medication in a couple years. HgA1c pending. She will need to be started on oral hyperglycemics. Possibly Metformin at least 48 hours after heart cath.   Tobacco abuse: she needs to quit this.  Bipolar disorder: patient says this has completely resolved, but daughter pulled me aside later and said this is ongoing but she just hasn't had a depressive episode recently. Per daughter, complaince with medications may be in issue due to this.   SignedEileen Stanford, PA-C 04/13/2016 2:22 PM  Pager [phone]  Co-Sign MD  I have seen, examined and evaluated the patient this PM along with Ms.  Grandville Silos, PA-c.  After reviewing all the available data and chart,  I agree with her findings, examination as well as impression recommendations.  Very pleasant woman who seems to be relatively oblivious of her cardiac risk factors of diabetes, and hyperlipidemia. She is a long-term smoker. She presents with signs and symptoms that are concerning for unstable angina with mild troponin elevation suggestive of ACS/non-STEMI.  We talked about appropriate course of action.  I would start IV heparin, aspirin, statin - along with fenofibrate given the hypercholesterolemia. We discussed early invasive versus early conservative management. My recommendation is to proceed to cardiac catheterization in order to definitively answer the extent of her possible coronary disease. Her symptoms are concerning for angina and seen to have been building up over the last few days. She currently is resting comfortably. We will therefore schedule her for cardiac catheterization in the morning with Dr. Claiborne Billings.  We'll put in cardiac catheterization orders.   Performing MD:  Howell Rucks, M.D.  Procedure:  Left Heart Catheterization with Coronary Angiography and Possible Percutaneous Coronary Intervention  The procedure with Risks/Benefits/Alternatives and Indications was reviewed with the patient.  All questions were answered.    Risks / Complications include, but not limited to: Death, MI, CVA/TIA, VF/VT (with defibrillation), Bradycardia (need for temporary pacer placement), contrast induced nephropathy, bleeding / bruising / hematoma / pseudoaneurysm, vascular or coronary injury (with possible emergent CT or Vascular Surgery), adverse medication reactions, infection.  Additional risks involving the use of radiation with the possibility of radiation burns and cancer were explained in detail.  The patient voices understanding and agree to proceed.      Leonie Man, M.D., M.S. Interventional Cardiologist    Pager # [phone] Phone # [phone] [address] [place], [place] [address]

## 2016-04-14 ENCOUNTER — Encounter (HOSPITAL_COMMUNITY)
Admission: EM | Disposition: A | Discharge status: Home / Self Care | Payer: Self-pay | Source: Home / Self Care | Attending: Internal Medicine

## 2016-04-14 ENCOUNTER — Other Ambulatory Visit (HOSPITAL_COMMUNITY): Payer: BLUE CROSS/BLUE SHIELD

## 2016-04-14 ENCOUNTER — Ambulatory Visit (HOSPITAL_COMMUNITY): Payer: BLUE CROSS/BLUE SHIELD

## 2016-04-14 DIAGNOSIS — R079 Chest pain, unspecified: Secondary | ICD-10-CM

## 2016-04-14 DIAGNOSIS — I2510 Atherosclerotic heart disease of native coronary artery without angina pectoris: Secondary | ICD-10-CM

## 2016-04-14 HISTORY — PX: CARDIAC CATHETERIZATION: SHX172

## 2016-04-14 LAB — GLUCOSE, CAPILLARY
Glucose-Capillary: 238 mg/dL — ABNORMAL HIGH (ref 65–99)
Glucose-Capillary: 167 mg/dL — ABNORMAL HIGH (ref 65–99)
Glucose-Capillary: 269 mg/dL — ABNORMAL HIGH (ref 65–99)
Glucose-Capillary: 225 mg/dL — ABNORMAL HIGH (ref 65–99)

## 2016-04-14 LAB — HEMOGLOBIN A1C
HEMOGLOBIN A1C: 13.8 % — AB (ref 4.8–5.6)
MEAN PLASMA GLUCOSE: 349 mg/dL

## 2016-04-14 LAB — T4: T4, Total: 1.4 ug/dL — ABNORMAL LOW (ref 4.5–12.0)

## 2016-04-14 LAB — T3: T3 TOTAL: 55 ng/dL — AB (ref 71–180)

## 2016-04-14 LAB — HEPARIN LEVEL (UNFRACTIONATED): HEPARIN UNFRACTIONATED: 0.14 [IU]/mL — AB (ref 0.30–0.70)

## 2016-04-14 LAB — T3, FREE: T3 FREE: 1.4 pg/mL — AB (ref 2.0–4.4)

## 2016-04-14 LAB — ECHOCARDIOGRAM COMPLETE
Height: 64 in
WEIGHTICAEL: 2012.8 [oz_av]

## 2016-04-14 LAB — PROTIME-INR
INR: 1.01 (ref 0.00–1.49)
PROTHROMBIN TIME: 13.5 s (ref 11.6–15.2)

## 2016-04-14 LAB — MRSA PCR SCREENING: MRSA BY PCR: NEGATIVE

## 2016-04-14 LAB — POCT ACTIVATED CLOTTING TIME: Activated Clotting Time: 306 s

## 2016-04-14 LAB — TROPONIN I: TROPONIN I: 0.27 ng/mL — AB (ref <0.031)

## 2016-04-14 SURGERY — LEFT HEART CATH AND CORONARY ANGIOGRAPHY
Anesthesia: LOCAL

## 2016-04-14 MED ORDER — ASPIRIN EC 81 MG PO TBEC
81.0000 mg | DELAYED_RELEASE_TABLET | Freq: Every day | ORAL | Status: DC
  Administered 2016-04-15 – 2016-04-16 (×2): 81 mg via ORAL
  Filled 2016-04-14 (×2): qty 1

## 2016-04-14 MED ORDER — TICAGRELOR 90 MG PO TABS
ORAL_TABLET | ORAL | Status: AC
  Filled 2016-04-14: qty 1

## 2016-04-14 MED ORDER — SODIUM CHLORIDE 0.9 % IV SOLN: INTRAVENOUS | Status: DC

## 2016-04-14 MED ORDER — SODIUM CHLORIDE 0.9% FLUSH: 3.0000 mL | INTRAVENOUS | Status: DC | PRN

## 2016-04-14 MED ORDER — HYDRALAZINE HCL 20 MG/ML IJ SOLN
10.0000 mg | Freq: Four times a day (QID) | INTRAMUSCULAR | Status: DC | PRN
  Administered 2016-04-14: 10 mg via INTRAVENOUS
  Filled 2016-04-14: qty 1

## 2016-04-14 MED ORDER — SODIUM CHLORIDE 0.9 % WEIGHT BASED INFUSION: 1.0000 mL/kg/h | INTRAVENOUS | Status: DC

## 2016-04-14 MED ORDER — ATROPINE SULFATE 1 MG/10ML IJ SOSY
PREFILLED_SYRINGE | INTRAMUSCULAR | Status: AC
  Filled 2016-04-14: qty 10

## 2016-04-14 MED ORDER — IOPAMIDOL (ISOVUE-370) INJECTION 76%
INTRAVENOUS | Status: AC
  Filled 2016-04-14: qty 50

## 2016-04-14 MED ORDER — IOPAMIDOL (ISOVUE-370) INJECTION 76%
INTRAVENOUS | Status: AC
  Filled 2016-04-14: qty 100

## 2016-04-14 MED ORDER — NITROGLYCERIN 1 MG/10 ML FOR IR/CATH LAB
INTRA_ARTERIAL | Status: DC | PRN
  Administered 2016-04-14: 200 ug via INTRACORONARY
  Administered 2016-04-14: 100 ug via INTRACORONARY
  Administered 2016-04-14: 200 ug via INTRACORONARY
  Administered 2016-04-14: 100 ug via INTRACORONARY

## 2016-04-14 MED ORDER — TICAGRELOR 90 MG PO TABS
ORAL_TABLET | ORAL | Status: DC | PRN
  Administered 2016-04-14: 180 mg via ORAL

## 2016-04-14 MED ORDER — NITROGLYCERIN 1 MG/10 ML FOR IR/CATH LAB
INTRA_ARTERIAL | Status: AC
  Filled 2016-04-14: qty 10

## 2016-04-14 MED ORDER — SODIUM CHLORIDE 0.9 % WEIGHT BASED INFUSION: 3.0000 mL/kg/h | INTRAVENOUS | Status: DC

## 2016-04-14 MED ORDER — HEPARIN (PORCINE) IN NACL 2-0.9 UNIT/ML-% IJ SOLN
INTRAMUSCULAR | Status: DC | PRN
  Administered 2016-04-14: 1500 mL

## 2016-04-14 MED ORDER — IOPAMIDOL (ISOVUE-370) INJECTION 76%
INTRAVENOUS | Status: AC
  Filled 2016-04-14: qty 125

## 2016-04-14 MED ORDER — SODIUM CHLORIDE 0.9 % IV SOLN
1.7500 mg/kg/h | INTRAVENOUS | Status: AC
  Filled 2016-04-14: qty 250

## 2016-04-14 MED ORDER — BIVALIRUDIN 250 MG IV SOLR
INTRAVENOUS | Status: AC
  Filled 2016-04-14: qty 250

## 2016-04-14 MED ORDER — FENTANYL CITRATE (PF) 100 MCG/2ML IJ SOLN
INTRAMUSCULAR | Status: DC | PRN
  Administered 2016-04-14 (×4): 25 ug via INTRAVENOUS

## 2016-04-14 MED ORDER — BIVALIRUDIN BOLUS VIA INFUSION - CUPID
INTRAVENOUS | Status: DC | PRN
  Administered 2016-04-14: 42.825 mg via INTRAVENOUS

## 2016-04-14 MED ORDER — ASPIRIN 81 MG PO CHEW: 81.0000 mg | CHEWABLE_TABLET | ORAL | Status: DC

## 2016-04-14 MED ORDER — HEPARIN (PORCINE) IN NACL 2-0.9 UNIT/ML-% IJ SOLN
INTRAMUSCULAR | Status: AC
  Filled 2016-04-14: qty 500

## 2016-04-14 MED ORDER — HYDRALAZINE HCL 20 MG/ML IJ SOLN: 10.0000 mg | Freq: Once | INTRAMUSCULAR | Status: DC

## 2016-04-14 MED ORDER — SODIUM CHLORIDE 0.9% FLUSH
3.0000 mL | Freq: Two times a day (BID) | INTRAVENOUS | Status: DC
  Administered 2016-04-14 – 2016-04-15 (×2): 3 mL via INTRAVENOUS

## 2016-04-14 MED ORDER — MIDAZOLAM HCL 2 MG/2ML IJ SOLN
INTRAMUSCULAR | Status: DC | PRN
  Administered 2016-04-14 (×3): 1 mg via INTRAVENOUS

## 2016-04-14 MED ORDER — SODIUM CHLORIDE 0.9 % WEIGHT BASED INFUSION
1.0000 mL/kg/h | INTRAVENOUS | Status: DC
  Administered 2016-04-14: 1 mL/kg/h via INTRAVENOUS

## 2016-04-14 MED ORDER — SODIUM CHLORIDE 0.9 % IV SOLN: 250.0000 mL | INTRAVENOUS | Status: DC | PRN

## 2016-04-14 MED ORDER — ACETAMINOPHEN 325 MG PO TABS: 650.0000 mg | ORAL_TABLET | ORAL | Status: DC | PRN

## 2016-04-14 MED ORDER — LIDOCAINE HCL (PF) 1 % IJ SOLN
INTRAMUSCULAR | Status: DC | PRN
  Administered 2016-04-14: 2 mL
  Administered 2016-04-14: 20 mL via INTRADERMAL

## 2016-04-14 MED ORDER — SODIUM CHLORIDE 0.9% FLUSH: 3.0000 mL | Freq: Two times a day (BID) | INTRAVENOUS | Status: DC

## 2016-04-14 MED ORDER — ASPIRIN 81 MG PO CHEW
81.0000 mg | CHEWABLE_TABLET | ORAL | Status: AC
  Administered 2016-04-14: 81 mg via ORAL
  Filled 2016-04-14: qty 1

## 2016-04-14 MED ORDER — SODIUM CHLORIDE 0.9 % IV SOLN
250.0000 mg | INTRAVENOUS | Status: DC | PRN
  Administered 2016-04-14 (×2): 1.75 mg/kg/h via INTRAVENOUS

## 2016-04-14 MED ORDER — MIDAZOLAM HCL 2 MG/2ML IJ SOLN
INTRAMUSCULAR | Status: AC
  Filled 2016-04-14: qty 2

## 2016-04-14 MED ORDER — FENTANYL CITRATE (PF) 100 MCG/2ML IJ SOLN
INTRAMUSCULAR | Status: AC
  Filled 2016-04-14: qty 2

## 2016-04-14 MED ORDER — ATROPINE SULFATE 1 MG/10ML IJ SOSY
PREFILLED_SYRINGE | INTRAMUSCULAR | Status: DC | PRN
  Administered 2016-04-14: 0.5 mg via INTRAVENOUS

## 2016-04-14 MED ORDER — LIDOCAINE HCL (PF) 1 % IJ SOLN
INTRAMUSCULAR | Status: AC
  Filled 2016-04-14: qty 30

## 2016-04-14 MED ORDER — DIAZEPAM 5 MG PO TABS: 5.0000 mg | ORAL_TABLET | Freq: Four times a day (QID) | ORAL | Status: DC | PRN

## 2016-04-14 MED ORDER — ATORVASTATIN CALCIUM 80 MG PO TABS
80.0000 mg | ORAL_TABLET | Freq: Every day | ORAL | Status: DC
  Administered 2016-04-14 – 2016-04-15 (×2): 80 mg via ORAL
  Filled 2016-04-14 (×2): qty 1

## 2016-04-14 MED ORDER — ONDANSETRON HCL 4 MG/2ML IJ SOLN: 4.0000 mg | Freq: Four times a day (QID) | INTRAMUSCULAR | Status: DC | PRN

## 2016-04-14 MED ORDER — TICAGRELOR 90 MG PO TABS
90.0000 mg | ORAL_TABLET | Freq: Two times a day (BID) | ORAL | Status: DC
  Administered 2016-04-14 – 2016-04-16 (×4): 90 mg via ORAL
  Filled 2016-04-14 (×4): qty 1

## 2016-04-14 MED ORDER — MORPHINE SULFATE (PF) 2 MG/ML IV SOLN: 2.0000 mg | INTRAVENOUS | Status: DC | PRN

## 2016-04-14 MED ORDER — VERAPAMIL HCL 2.5 MG/ML IV SOLN
INTRAVENOUS | Status: AC
  Filled 2016-04-14: qty 2

## 2016-04-14 MED ORDER — HEPARIN (PORCINE) IN NACL 2-0.9 UNIT/ML-% IJ SOLN
INTRAMUSCULAR | Status: AC
  Filled 2016-04-14: qty 1000

## 2016-04-14 MED ORDER — IOPAMIDOL (ISOVUE-370) INJECTION 76%
INTRAVENOUS | Status: DC | PRN
  Administered 2016-04-14: 365 mL via INTRA_ARTERIAL

## 2016-04-14 SURGICAL SUPPLY — 24 items
BALLN EUPHORA RX 2.0X15 (BALLOONS) ×2
BALLN ~~LOC~~ EMERGE MR 2.5X15 (BALLOONS) ×2
BALLN ~~LOC~~ EUPHORA RX 3.25X12 (BALLOONS) ×2
BALLOON EUPHORA RX 2.0X15 (BALLOONS) IMPLANT
BALLOON ~~LOC~~ EMERGE MR 2.5X15 (BALLOONS) IMPLANT
BALLOON ~~LOC~~ EUPHORA RX 3.25X12 (BALLOONS) IMPLANT
CATH INFINITI 5FR MULTPACK ANG (CATHETERS) ×1 IMPLANT
CATH VISTA GUIDE 6FR JR4 (CATHETERS) ×1 IMPLANT
CATH VISTA GUIDE 6FR XBLAD3.5 (CATHETERS) ×1 IMPLANT
GLIDESHEATH SLEND SS 6F .021 (SHEATH) ×1 IMPLANT
KIT ENCORE 26 ADVANTAGE (KITS) ×1 IMPLANT
KIT HEART LEFT (KITS) ×2 IMPLANT
PACK CARDIAC CATHETERIZATION (CUSTOM PROCEDURE TRAY) ×2 IMPLANT
SHEATH PINNACLE 5F 10CM (SHEATH) ×1 IMPLANT
SHEATH PINNACLE 6F 10CM (SHEATH) ×1 IMPLANT
STENT SYNERGY DES 2.25X20 (Permanent Stent) ×1 IMPLANT
STENT SYNERGY DES 2.5X38 (Permanent Stent) ×1 IMPLANT
STENT SYNERGY DES 3X12 (Permanent Stent) ×1 IMPLANT
STENT SYNERGY DES 3X24 (Permanent Stent) ×1 IMPLANT
SYR MEDRAD MARK V 150ML (SYRINGE) ×2 IMPLANT
TRANSDUCER W/STOPCOCK (MISCELLANEOUS) ×2 IMPLANT
TUBING CIL FLEX 10 FLL-RA (TUBING) ×2 IMPLANT
WIRE EMERALD 3MM-J .035X150CM (WIRE) ×1 IMPLANT
WIRE PT2 MS 185 (WIRE) ×1 IMPLANT

## 2016-04-14 NOTE — Progress Notes (Signed)
  Echocardiogram 2D Echocardiogram has been performed.  Abigail [name] 04/14/2016, 4:12 PM

## 2016-04-14 NOTE — H&P (View-Only) (Signed)
CARDIOLOGY CONSULT NOTE   Patient ID: Abigail [name] MRN: [id number] DOB/AGE: [birth year] 55 y.o.  Admit date: 04/12/2016  Requesting Physician: Dr. Doyle Askew Primary Physician:   No PCP Per Patient Primary Cardiologist:   None Reason for Consultation: NSTEMI  HPI: Abigail [name] is a 55 y.o. female with a history of uncontrolled DM, HLD, hypothyroidism, uterine cancer, tobacco abuse and bipolar disorder who presented to [facility] on 04/12/16 with chest pain and ruled in NSTEMI. Cardiology consulted.   She is originally from [place] but has lived in [place] for quite some time. She has 3 daughters, two of which live locally.   She has a history of DMT2 for which she has not treated with medications for over 1 year. She only uses herbal medications. Work up in the ED showed initial troponin negative. However, she ruled in over night. Troponin  0.1--> 0.2. Other labs notable for TC > 400 and TG >500. TSH 65.   Patient was in her usual state of health until yesterday while driving when she had a left-sided chest pressure that was associated with diaphoresis and lightheadedness. She denies shortness of breath, radiation or nausea. It went away and then returned about 40 minutes later while laying in bed. This prompted her to be seen in the emergency department. However, the chest pain had resolved by that time and has not recurred since. The patient is at baseline very immobile due to peripheral neuropathy and arthritis. She does live on the third floor of an apartment building and climbs steps every day with no problems. However, she has she has to do this extremely slowly.   She said that she had seen a primary care doctor in the past 6 months at [facility]. However, her daughter pulled me aside in the hallway and told me that this was a lie. The patient has a long history of bipolar disorder but has recently has not had a bout of depression. The patient told me that her bipolar disorder has  resolved completely. She is currently not on medication for this. However, her daughter did bring up that compliance was going to be an issue.  Patient has a family history of CAD in her mother. Her first event was in her 54s. She also has a history of CAD in both her grandparents on her maternal and paternal side. She smokes a pack of cigarettes in 1-2 days and has been doing this for over 25 years.    Past Medical History  Diagnosis Date  . Diabetes mellitus without complication ([place])   . Cancer ([place])     uterus  . Bipolar disorder [facility])      Past Surgical History  Procedure Laterality Date  . Abdominal hysterectomy      No Known Allergies  I have reviewed the patient's current medications . aspirin  325 mg Oral Daily  . atorvastatin  20 mg Oral q1800  . docusate sodium  100 mg Oral BID  . enoxaparin (LOVENOX) injection  30 mg Subcutaneous Q24H  . insulin aspart  0-15 Units Subcutaneous TID WC  . insulin aspart  0-5 Units Subcutaneous QHS  . insulin glargine  10 Units Subcutaneous QHS  . levothyroxine  50 mcg Oral QAC breakfast  . living well with diabetes book   Does not apply Once  . nicotine  14 mg Transdermal Daily  . sodium chloride flush  3 mL Intravenous Q12H     acetaminophen **OR** acetaminophen  Prior to Admission medications  Not on File     Social History   Social History  . Marital Status: Divorced    Spouse Name: N/A  . Number of Children: N/A  . Years of Education: N/A   Occupational History  . Not on file.   Social History Main Topics  . Smoking status: Current Every Day Smoker  . Smokeless tobacco: Never Used  . Alcohol Use: No  . Drug Use: No  . Sexual Activity: Not on file   Other Topics Concern  . Not on file   Social History Narrative    No family status information on file.   No family history on file.   ROS:  Full 14 point review of systems complete and found to be negative unless listed above.  Physical Exam: Blood  pressure 112/61, pulse 71, temperature 99 F (37.2 C), temperature source Oral, resp. rate 18, height 5\' 4"  (1.626 m), weight 124 lb 12.8 oz (56.609 kg), SpO2 97 %.  General: Well developed, well nourished, female in no acute distress Head: Eyes PERRLA, No xanthomas.   Normocephalic and atraumatic, oropharynx without edema or exudate.  Lungs: CTAB  Heart: HRRR S1 S2, no rub/gallop, Heart regular rate and rhythm with S1, S2  murmur. pulses are 2+ extrem.   Neck: No carotid bruits. No lymphadenopathy.  No JVD. Abdomen: Bowel sounds present, abdomen soft and non-tender without masses or hernias noted. Msk:  No spine or cva tenderness. No weakness, no joint deformities or effusions. Extremities: No clubbing or cyanosis. No LE edema.  Neuro: Alert and oriented X 3. No focal deficits noted. Psych:  Good affect, responds appropriately Skin: No rashes or lesions noted.  Labs:   Lab Results  Component Value Date   WBC 8.1 04/13/2016   HGB 13.1 04/13/2016   HCT 38.1 04/13/2016   MCV 97.2 04/13/2016   PLT 294 04/13/2016   No results for input(s): INR in the last 72 hours.   Recent Labs Lab 04/13/16 0648  NA 140  K 3.7  CL 106  CO2 26  BUN 9  CREATININE 0.62  CALCIUM 9.2  GLUCOSE 340*   No results found for: MG  Recent Labs  04/13/16 0055 04/13/16 0648 04/13/16 1234  TROPONINI 0.10* 0.20* 0.35*    Recent Labs  04/12/16 2041  TROPIPOC 0.02   No results found for: PROBNP Lab Results  Component Value Date   CHOL 403* 04/13/2016   HDL 42 04/13/2016   LDLCALC UNABLE TO CALCULATE IF TRIGLYCERIDE OVER 400 mg/dL 04/13/2016   TRIG 526* 04/13/2016    TSH  Date/Time Value Ref Range Status  04/13/2016 12:55 AM 64.977* 0.350 - 4.500 uIU/mL Final    Echo: pending.  ECG:  HR 75. NSR no acute ST or TW changes  Radiology:  Dg Chest 2 View  04/12/2016  CLINICAL DATA:  Chest pain EXAM: CHEST  2 VIEW COMPARISON:  None. FINDINGS: Normal heart size and mediastinal contours. No  acute infiltrate or edema. Calcified granuloma in the posterior right costophrenic sulcus. No effusion or pneumothorax. Calcific tendinitis of the right rotator cuff. IMPRESSION: No active cardiopulmonary disease. Calcific tendinitis right rotator cuff. Electronically Signed   By: Monte Fantasia M.D.   On: 04/12/2016 21:10    ASSESSMENT AND PLAN:    Principal Problem:   NSTEMI (non-ST elevated myocardial infarction) ([place]) Active Problems:   Diabetes ([place])   Bipolar 1 disorder (HCC)   Tobacco abuse   History of uterine cancer   Noncompliance   Hypertriglyceridemia  Abigail [name] is a 55 y.o. female with a history of uncontrolled DM, HLD, hypothyroidism, uterine cancer, tobacco abuse and bipolar disorder who presented to [facility] on 04/12[facility]16 with chest pain.   NSTEMI: Work up in the ED showed initial troponin negative. However, she ruled in over night. Troponin  0.1--> 0.2--> 0.35. Will start IV heparin.  She will need a LHC. NPO after midnight. If she requires stenting, may want to consider BMS as compliance with DAPT for 1 year may be difficult for her.   HLD: TC > 400 and TG >500. Started on a statin. If cath with significant CAD will need high dose statin  Hypertriglyceridemia: will start fenofibrate 160mg  daily.   Hypothyroidism: TSH 65. Started on synthroid   Uncontrolled DM: has taken medication in a couple years. HgA1c pending. She will need to be started on oral hyperglycemics. Possibly Metformin at least 48 hours after heart cath.   Tobacco abuse: she needs to quit this.  Bipolar disorder: patient says this has completely resolved, but daughter pulled me aside later and said this is ongoing but she just hasn't had a depressive episode recently. Per daughter, complaince with medications may be in issue due to this.   SignedEileen Stanford, PA-C 04/13/2016 2:22 PM  Pager [phone]  Co-Sign MD  I have seen, examined and evaluated the patient this PM along with Ms.  Grandville Silos, PA-c.  After reviewing all the available data and chart,  I agree with her findings, examination as well as impression recommendations.  Very pleasant woman who seems to be relatively oblivious of her cardiac risk factors of diabetes, and hyperlipidemia. She is a long-term smoker. She presents with signs and symptoms that are concerning for unstable angina with mild troponin elevation suggestive of ACS/non-STEMI.  We talked about appropriate course of action.  I would start IV heparin, aspirin, statin - along with fenofibrate given the hypercholesterolemia. We discussed early invasive versus early conservative management. My recommendation is to proceed to cardiac catheterization in order to definitively answer the extent of her possible coronary disease. Her symptoms are concerning for angina and seen to have been building up over the last few days. She currently is resting comfortably. We will therefore schedule her for cardiac catheterization in the morning with Dr. Claiborne Billings.  We'll put in cardiac catheterization orders.   Performing MD:  Howell Rucks, M.D.  Procedure:  Left Heart Catheterization with Coronary Angiography and Possible Percutaneous Coronary Intervention  The procedure with Risks/Benefits/Alternatives and Indications was reviewed with the patient.  All questions were answered.    Risks / Complications include, but not limited to: Death, MI, CVA/TIA, VF/VT (with defibrillation), Bradycardia (need for temporary pacer placement), contrast induced nephropathy, bleeding / bruising / hematoma / pseudoaneurysm, vascular or coronary injury (with possible emergent CT or Vascular Surgery), adverse medication reactions, infection.  Additional risks involving the use of radiation with the possibility of radiation burns and cancer were explained in detail.  The patient voices understanding and agree to proceed.      Leonie Man, M.D., M.S. Interventional Cardiologist    Pager # [phone] Phone # [phone] [address] [place], [place] [address]

## 2016-04-14 NOTE — Interval H&P Note (Signed)
Cath Lab Visit (complete for each Cath Lab visit)  Clinical Evaluation Leading to the Procedure:   ACS: Yes.    Non-ACS:    Anginal Classification: CCS III  Anti-ischemic medical therapy: Maximal Therapy (2 or more classes of medications)  Non-Invasive Test Results: No non-invasive testing performed  Prior CABG: No previous CABG      History and Physical Interval Note:  04/14/2016 7:42 AM  Abigail [name]  has presented today for surgery, with the diagnosis of nstemi  The various methods of treatment have been discussed with the patient and family. After consideration of risks, benefits and other options for treatment, the patient has consented to  Procedure(s): Left Heart Cath and Coronary Angiography (N/A) as a surgical intervention .  The patient's history has been reviewed, patient examined, no change in status, stable for surgery.  I have reviewed the patient's chart and labs.  Questions were answered to the patient's satisfaction.     KELLY,THOMAS A

## 2016-04-14 NOTE — Progress Notes (Addendum)
Patient ID: Abigail [name], female   DOB: 1961-06-17, 55 y.o.   MRN: [id number]   PROGRESS NOTE    Abigail [name]  [id number] DOB: [birth year] DOA: 04/12/2016  PCP: No PCP Per Patient   Outpatient Specialists:   Brief Narrative:  55 year old female with history of diabetes untreated for over a year, presented for evaluation of left-sided chest pain.  Assessment & Plan:   Active Problems:   Chest pain, NSTEMI in pt with multiple risk factors, medical non compliance  - started IV Heparin per cardiology, appreciate cardiology team assistance  - plan for LHC today  - started aspirin, lipitor    Diabetes, uncontrolled with A1C > 13, medical non compliance (HCC) - A1C 13.8 - continue SSI for now, added Lantus 10 U QHS - diabetic educator consulted and assistance is appreciated     HLD, hypertriglyceridemia  - with cholesterol > 400 - started statin     Hypothyroidism - TSH > 64, order free T4, T3 - start synthroid 50 mcg PO QD - outpatient follow up     Bipolar 1 disorder ([place])    Tobacco abuse - continue Nicotine patch   DVT prophylaxis: Lovenox SQ Code Status: Full  Family Communication: Patient at bedside  Disposition Plan: once cardiology team clears   Consultants:   Cardiology   Procedures:   Stress test  Antimicrobials:  None   Subjective: Intermittent chest pain, substernal.   Objective: Filed Vitals:   04/13/16 0513 04/13/16 1254 04/13/16 2101 04/14/16 0537  BP: 118/62 112/61 135/63 127/57  Pulse: 64 71 67 64  Temp: 98.1 F (36.7 C) 99 F (37.2 C) 98 F (36.7 C) 97.8 F (36.6 C)  TempSrc: Oral Oral Oral Oral  Resp: 18 18 18 18   Height:      Weight:    57.063 kg (125 lb 12.8 oz)  SpO2: 97% 97% 93% 94%    Intake/Output Summary (Last 24 hours) at 04/14/16 0659 Last data filed at 04/13/16 2345  Gross per 24 hour  Intake    403 ml  Output   1300 ml  Net   -897 ml   Filed Weights   04/13/16 0039 04/14/16 0537  Weight: 56.609  kg (124 lb 12.8 oz) 57.063 kg (125 lb 12.8 oz)    Examination:  General exam: Appears calm and comfortable  Respiratory system: Clear to auscultation. Respiratory effort normal. Cardiovascular system: S1 & S2 heard, RRR. No JVD, murmurs, rubs, gallops or clicks. No pedal edema. Gastrointestinal system: Abdomen is nondistended, soft and nontender. Central nervous system: Alert and oriented. No focal neurological deficits. Extremities: Symmetric 5 x 5 power. Skin: No rashes, lesions or ulcers Psychiatry: Judgement and insight appear normal. Mood & affect appropriate.   Data Reviewed: I have personally reviewed following labs and imaging studies  CBC:  Recent Labs Lab 04/12/16 2017 04/13/16 0055 04/13/16 0648  WBC 9.6 9.7 8.1  NEUTROABS 4.7  --   --   HGB 12.6 13.8 13.1  HCT 37.2 40.2 38.1  MCV 95.9 96.2 97.2  PLT 302 324 [id number]   Basic Metabolic Panel:  Recent Labs Lab 04/12/16 2017 04/13/16 0055 04/13/16 0648  NA 136  --  140  K 3.8  --  3.7  CL 102  --  106  CO2 24  --  26  GLUCOSE 362*  --  340*  BUN 10  --  9  CREATININE 0.63 0.62 0.62  CALCIUM 9.3  --  9.2   Cardiac Enzymes:  Recent  Labs Lab 04/13/16 0055 04/13/16 0648 04/13/16 1234  TROPONINI 0.10* 0.20* 0.35*   CBG:  Recent Labs Lab 04/13/16 0043 04/13/16 0650 04/13/16 1231 04/13/16 1549 04/13/16 2103  GLUCAP 224* 293* 179* 278* 256*   Lipid Profile:  Recent Labs  04/13/16 0055  CHOL 403*  HDL 42  LDLCALC UNABLE TO CALCULATE IF TRIGLYCERIDE OVER 400 mg/dL  TRIG 526*  CHOLHDL 9.6   Thyroid Function Tests:  Recent Labs  04/13/16 0055 04/13/16 0752  TSH 64.977*  --   T4TOTAL  --  1.4*  FREET4  --  0.36*  T3FREE  --  1.4*    Radiology Studies: Dg Chest 2 View 04/12/2016  No active cardiopulmonary disease. Calcific tendinitis right rotator cuff.   Scheduled Meds: . aspirin  81 mg Oral Pre-Cath  . aspirin  325 mg Oral Daily  . atorvastatin  20 mg Oral q1800  . docusate sodium   100 mg Oral BID  . fenofibrate  160 mg Oral Daily  . insulin aspart  0-15 Units Subcutaneous TID WC  . insulin aspart  0-5 Units Subcutaneous QHS  . insulin glargine  10 Units Subcutaneous QHS  . levothyroxine  50 mcg Oral QAC breakfast  . nicotine  14 mg Transdermal Daily  . sodium chloride flush  3 mL Intravenous Q12H  . sodium chloride flush  3 mL Intravenous Q12H   Continuous Infusions:  Time spent: 20 minutes   Faye Ramsay, MD [facility] Pager [phone]  If 7PM-7AM, please contact night-coverage [email or web address] Password [email or web address] 04/14/2016, 6:59 AM    \

## 2016-04-15 DIAGNOSIS — R072 Precordial pain: Secondary | ICD-10-CM

## 2016-04-15 DIAGNOSIS — F319 Bipolar disorder, unspecified: Secondary | ICD-10-CM

## 2016-04-15 LAB — GLUCOSE, CAPILLARY
Glucose-Capillary: 178 mg/dL — ABNORMAL HIGH (ref 65–99)
Glucose-Capillary: 262 mg/dL — ABNORMAL HIGH (ref 65–99)
Glucose-Capillary: 238 mg/dL — ABNORMAL HIGH (ref 65–99)
Glucose-Capillary: 242 mg/dL — ABNORMAL HIGH (ref 65–99)

## 2016-04-15 LAB — CBC
HCT: 36.9 % (ref 36.0–46.0)
Hemoglobin: 12.5 g/dL (ref 12.0–15.0)
MCH: 33.4 pg (ref 26.0–34.0)
MCHC: 33.9 g/dL (ref 30.0–36.0)
MCV: 98.7 fL (ref 78.0–100.0)
Platelets: 289 K/uL (ref 150–400)
RBC: 3.74 MIL/uL — ABNORMAL LOW (ref 3.87–5.11)
RDW: 13.1 % (ref 11.5–15.5)
WBC: 12.8 K/uL — ABNORMAL HIGH (ref 4.0–10.5)

## 2016-04-15 LAB — BASIC METABOLIC PANEL WITH GFR
Anion gap: 10 (ref 5–15)
BUN: <5 mg/dL — ABNORMAL LOW (ref 6–20)
CO2: 23 mmol/L (ref 22–32)
Calcium: 8.9 mg/dL (ref 8.9–10.3)
Chloride: 106 mmol/L (ref 101–111)
Creatinine, Ser: 0.6 mg/dL (ref 0.44–1.00)
GFR calc Af Amer: >60 mL/min
GFR calc non Af Amer: >60 mL/min
Glucose, Bld: 171 mg/dL — ABNORMAL HIGH (ref 65–99)
Potassium: 3.4 mmol/L — ABNORMAL LOW (ref 3.5–5.1)
Sodium: 139 mmol/L (ref 135–145)

## 2016-04-15 MED ORDER — POTASSIUM CHLORIDE CRYS ER 20 MEQ PO TBCR
40.0000 meq | EXTENDED_RELEASE_TABLET | Freq: Once | ORAL | Status: AC
  Administered 2016-04-15: 40 meq via ORAL
  Filled 2016-04-15: qty 2

## 2016-04-15 NOTE — Progress Notes (Signed)
Pt transferred via wheelchair with belongings to [phone] via wheelchair and telemetry. Pt informing her family of new room number.

## 2016-04-15 NOTE — Progress Notes (Addendum)
CARDIAC REHAB PHASE I   PRE:  Rate/Rhythm: 83 SR  BP:   Sitting: 93/51     SaO2: 97%RA  MODE:  Ambulation: 440 ft   POST:  Rate/Rhythm: 94 SR  BP:   Sitting: 92/53     SaO2: 98% RA 1151-1230  Pt ambulated 461ft with walker and min A. Pt denies SPB, dizziness or lightheadedness before and during walk. Discussed with RN of low BP. RN was made aware and felt that it was OK for pt to walk. Pt walked well in hallways. Returned pt to recliner with LE elevated and call bell within reach. Educated pt on risk factors, activity/liftign restrictions, diabetics nutrition and HH diet. Also discussed smoking cessation, stent card and Brilinta/ASA, NTG use and when to call 911. Given exercise guidelines, how to progress and temperature precautions. Pt voiced understanding. Referred pt to [facility] cardiac rehab.  Shaneca Orne D Paysley Poplar,MS,ACSM-RCEP 04/15/2016 12:37 PM

## 2016-04-15 NOTE — Progress Notes (Addendum)
Cardiologist: Dr. Ellyn Hack Subjective:  Original consultation for non-ST elevation myocardial infarction.  55 year old female with uncontrolled diabetes, hyperlipidemia, uterine cancer, tobacco use, bipolar disorder with non-ST elevation myocardial infarction presenting on 04/12/16. Underwent cardiac catheterization on 04/14/16. See below.  No active chest pain, no shortness of breath  Objective:  Vital Signs in the last 24 hours: Temp:  [97.6 F (36.4 C)-98.8 F (37.1 C)] 98.2 F (36.8 C) (04/29 0800) Pulse Rate:  [55-85] 81 (04/29 1010) Resp:  [0-27] 17 (04/29 1010) BP: (99-171)/(50-97) 108/51 mmHg (04/29 1010) SpO2:  [90 %-100 %] 99 % (04/29 1010) Arterial Line BP: (130-187)/(58-81) 150/63 mmHg (04/28 1740)  Intake/Output from previous day: 04/28 0701 - 04/29 0700 In: 923.5 [P.O.:200; I.V.:723.5] Out: -    Physical Exam: General: Well developed, well nourished, in no acute distress. Head:  Normocephalic and atraumatic. Lungs: Clear to auscultation and percussion. Heart: Normal S1 and S2.  No murmur, rubs or gallops.  Abdomen: soft, non-tender, positive bowel sounds. Extremities: No clubbing or cyanosis. No edema. Neurologic: Alert and oriented x 3. Skin: No rashes or lesions, cath site normal Psychiatric: Good affect, responds appropriately    Lab Results:  Recent Labs  04/13/16 0648 04/15/16 0358  WBC 8.1 12.8*  HGB 13.1 12.5  PLT 294 289    Recent Labs  04/13/16 0648 04/15/16 0358  NA 140 139  K 3.7 3.4*  CL 106 106  CO2 26 23  GLUCOSE 340* 171*  BUN 9 <5*  CREATININE 0.62 0.60    Recent Labs  04/13/16 1234 04/14/16 1311  TROPONINI 0.35* 0.27*   Hepatic Function Panel No results for input(s): PROT, ALBUMIN, AST, ALT, ALKPHOS, BILITOT, BILIDIR, IBILI in the last 72 hours.  Recent Labs  04/13/16 0055  CHOL 403*   No results for input(s): PROTIME in the last 72 hours.  Imaging: No results found.   Telemetry: No adverse rhythms  Personally viewed.   EKG:  04/15/16- NSR, no ST changes Personally viewed.  Cardiac Studies:  Cardiac cath 04/14/16:  Prox RCA lesion, 80% stenosed.  Mid RCA lesion, 99% stenosed.  Mid RCA to Dist RCA lesion, 20% stenosed.  2nd Diag lesion, 90% stenosed.  Dist RCA lesion, 85% stenosed. Post intervention, there is a 0% residual stenosis.  Ost RCA to Prox RCA lesion, 80% stenosed. Post intervention, there is a 0% residual stenosis.  Dist LAD lesion, 80% stenosed. Post intervention, there is a 0% residual stenosis.  Mid LAD lesion, 85% stenosed. Post intervention, there is a 0% residual stenosis.  Severe two-vessel coronary obstructive disease with a normal left main; 85% eccentric proximal LAD stenosis in the region of a small upper takeoff diagonal that had 90% ostial stenosis, followed by 20% LAD stenosis and tubular 80% mid stenosis; normal ramus intermediate vessel, normal left circumflex vessel, and severely diseased RCA with 80% ostial stenosis followed by 80% proximal and 99% stenosis with thrombus from ulcerated plaque in the proximal to midsegment. There is 20% narrowing at the acute margin followed by long 85% stenosis in the distal RCA.  Difficult but successful complex intervention to the RCA including the ostium, proximal ,and mid regions as well as distal stenosis with the distal 85% stenosis being reduced to 0% with insertion of a 2.2520 mm Synergy stent in the mid to ostial stenoses being reduced to 0% with insertion of a 2.538 mm Synergy DES stent.  Successful PCI to the LAD with a complex proximal stenosis of 85% and 90% stenosis in the sharp  upper takeoff small diagonal vessel with long 80% tubular mid LAD stenoses. The mid LAD, 80% stenosis was reduced to 0% with insertion of a 3.024 mm Synergy stent in the proximal LAD, 85% stenosis was reduced to 0% with insertion of a 3.012 mm Synergy DES stent. There was absence antegrade flow in this small diagonal vessel with  evidence for collaterals following post stent dilatation proximally.  RECOMMENDATION: The patient will continue with bivalirudin for 4 hours post procedure in attempt to restore antegrade flow down the diagonal vessel. She will required to DAPT, and aggressive risk factor modification.  Dr. Claiborne Billings  ECHO: Done but no report in EPIC  Meds: Scheduled Meds: . aspirin EC  81 mg Oral Daily  . atorvastatin  80 mg Oral q1800  . docusate sodium  100 mg Oral BID  . fenofibrate  160 mg Oral Daily  . insulin aspart  0-15 Units Subcutaneous TID WC  . insulin aspart  0-5 Units Subcutaneous QHS  . insulin glargine  10 Units Subcutaneous QHS  . levothyroxine  50 mcg Oral QAC breakfast  . nicotine  14 mg Transdermal Daily  . sodium chloride flush  3 mL Intravenous Q12H  . sodium chloride flush  3 mL Intravenous Q12H  . ticagrelor  90 mg Oral BID   Continuous Infusions: . sodium chloride 300 mL (04/14/16 1048)   PRN Meds:.sodium chloride, acetaminophen **OR** acetaminophen, diazepam, hydrALAZINE, morphine injection, ondansetron (ZOFRAN) IV, sodium chloride flush  Assessment/Plan:  Principal Problem:   NSTEMI (non-ST elevated myocardial infarction) ([place]) Active Problems:   Diabetes ([place])   Bipolar 1 disorder ([place])   Tobacco abuse   History of uterine cancer   Noncompliance   Hypertriglyceridemia  Non-ST elevation myocardial infarction -Peak troponin was 0.35 in the setting of chest pain -Cardiac catheterization on 04/14/16, Dr. Claiborne Billings, complex RCA PCI ostium to mid, complex PCI LAD proximal to mid. There was no left main involvement. -Dual antiplatelet therapy -Statin, beta blocker, aspirin -Cardiac rehabilitation   Hyperlipidemia -Statin, fibrate -Triglycerides have been greater than 500, uncontrolled diabetes  Hypothyroidism -TSH 65, started on Synthroid  Uncontrolled diabetes -Per primary team.  Tobacco use -Tobacco cessation counseling  Bipolar disorder -She states that  this is completely resolved but the daughter said that this has been an ongoing issue and she did have depression recently. Compliant with medication may be an issue.  Hypokalemia -3.4, replete.  Anticipate transfer to floor today and DC tomorrow.   SKAINS, [place] 04/15/2016, 11:03 AM

## 2016-04-15 NOTE — Progress Notes (Signed)
Patient ID: Abigail [name], female   DOB: 1961-07-05, 55 y.o.   MRN: [id number]   PROGRESS NOTE    Abigail [name]  [id number] DOB: December 09, 1961 DOA: 04/12/2016  PCP: No PCP Per Patient   Outpatient Specialists:   Brief Narrative:  55 year old female with history of diabetes untreated for over a year, presented for evaluation of left-sided chest pain.  Assessment & Plan:   Active Problems:   Chest pain, NSTEMI in pt with multiple risk factors, medical non compliance  - s/p LHC with severe two-vessel coronary obstructive disease with 85% eccentric proximal LAD stenosis in the region of a small upper takeoff diagonal that had 90% ostial stenosis, followed by 20% LAD stenosis and tubular 80% mid stenosis, severely diseased RCA with 80% ostial stenosis followed by 80% proximal and 99% stenosis with thrombus from ulcerated plaque in the proximal to midsegment.  - successful complex intervention to the RCA including the ostium, proximal ,and mid regions as well as distal stenosis with the distal 85% stenosis being reduced to 0% with insertion of a 2.2520 mm Synergy stent in the mid to ostial stenoses being reduced to 0% with insertion of a 2.538 mm Synergy DES stent. - aggressive risk factor modification. - dual antiplatelet therapy - continue statin, beta blocker, aspirin - cardiac rehabilitation    Diabetes, uncontrolled with A1C > 13, medical non compliance (HCC) - A1C 13.8 - continue SSII, added Lantus 10 U QHS - diabetic educator consulted and assistance is appreciated     HLD, hypertriglyceridemia  - with cholesterol > 400 - started statin     Hypothyroidism - TSH > 64, order free T4, T3 - start synthroid 50 mcg PO QD - outpatient follow up     Hypokalemia - mild, supplement today  - BMP in AM    Bipolar 1 disorder ([place])    Tobacco abuse - continue Nicotine patch   DVT prophylaxis: Lovenox SQ Code Status: Full  Family Communication: Patient at bedside    Disposition Plan: home in AM 4/30  Consultants:   Cardiology   Procedures:   Stress test  [facility] 4/29  Antimicrobials:  None   Subjective: Reports being tired and sore.  Objective: Filed Vitals:   04/15/16 0200 04/15/16 0300 04/15/16 0327 04/15/16 0400  BP: 105/50 120/57 114/59   Pulse: 58 61 64 61  Temp:   98.8 F (37.1 C)   TempSrc:   Oral   Resp: 0 20 21 15   Height:      Weight:      SpO2: 100% 100% 99% 100%    Intake/Output Summary (Last 24 hours) at 04/15/16 0710 Last data filed at 04/14/16 2100  Gross per 24 hour  Intake 923.49 ml  Output      0 ml  Net 923.49 ml   Filed Weights   04/13/16 0039 04/14/16 0537  Weight: 56.609 kg (124 lb 12.8 oz) 57.063 kg (125 lb 12.8 oz)    Examination:  General exam: Appears calm and comfortable  Respiratory system: Clear to auscultation. Respiratory effort normal. Cardiovascular system: S1 & S2 heard, RRR. No JVD, murmurs, rubs, gallops or clicks. No pedal edema. Gastrointestinal system: Abdomen is nondistended, soft and nontender. Central nervous system: Alert and oriented. No focal neurological deficits. Extremities: Symmetric 5 x 5 power. Skin: No rashes, lesions or ulcers Psychiatry: Judgement and insight appear normal. Mood & affect appropriate.   Data Reviewed: I have personally reviewed following labs and imaging studies  CBC:  Recent Labs Lab 04/12/16  2017 04/13/16 0055 04/13/16 0648 04/15/16 0358  WBC 9.6 9.7 8.1 12.8*  NEUTROABS 4.7  --   --   --   HGB 12.6 13.8 13.1 12.5  HCT 37.2 40.2 38.1 36.9  MCV 95.9 96.2 97.2 98.7  PLT 302 324 294 [id number]   Basic Metabolic Panel:  Recent Labs Lab 04/12/16 2017 04/13/16 0055 04/13/16 0648 04/15/16 0358  NA 136  --  140 139  K 3.8  --  3.7 3.4*  CL 102  --  106 106  CO2 24  --  26 23  GLUCOSE 362*  --  340* 171*  BUN 10  --  9 <5*  CREATININE 0.63 0.62 0.62 0.60  CALCIUM 9.3  --  9.2 8.9   Cardiac Enzymes:  Recent Labs Lab 04/13/16 0055  04/13/16 0648 04/13/16 1234 04/14/16 1311  TROPONINI 0.10* 0.20* 0.35* 0.27*   CBG:  Recent Labs Lab 04/13/16 2103 04/14/16 0657 04/14/16 1053 04/14/16 1705 04/14/16 2115  GLUCAP 256* 238* 167* 269* 225*   Lipid Profile:  Recent Labs  04/13/16 0055  CHOL 403*  HDL 42  LDLCALC UNABLE TO CALCULATE IF TRIGLYCERIDE OVER 400 mg/dL  TRIG 526*  CHOLHDL 9.6   Thyroid Function Tests:  Recent Labs  04/13/16 0055 04/13/16 0752  TSH 64.977*  --   T4TOTAL  --  1.4*  FREET4  --  0.36*  T3FREE  --  1.4*    Radiology Studies: Dg Chest 2 View 04/12/2016  No active cardiopulmonary disease. Calcific tendinitis right rotator cuff.   Scheduled Meds: . aspirin EC  81 mg Oral Daily  . atorvastatin  80 mg Oral q1800  . docusate sodium  100 mg Oral BID  . fenofibrate  160 mg Oral Daily  . insulin aspart  0-15 Units Subcutaneous TID WC  . insulin aspart  0-5 Units Subcutaneous QHS  . insulin glargine  10 Units Subcutaneous QHS  . levothyroxine  50 mcg Oral QAC breakfast  . nicotine  14 mg Transdermal Daily  . sodium chloride flush  3 mL Intravenous Q12H  . sodium chloride flush  3 mL Intravenous Q12H  . ticagrelor  90 mg Oral BID   Continuous Infusions:  Time spent: 20 minutes   Faye Ramsay, MD [facility] Pager [phone]  If 7PM-7AM, please contact night-coverage [email or web address] Password [email or web address] 04/15/2016, 7:10 AM    \

## 2016-04-15 NOTE — Progress Notes (Signed)
Report completed to unit 3 [facility], pt will transfer to telemetry bed, room [phone].

## 2016-04-16 DIAGNOSIS — I209 Angina pectoris, unspecified: Secondary | ICD-10-CM

## 2016-04-16 LAB — BASIC METABOLIC PANEL
ANION GAP: 9 (ref 5–15)
BUN: 8 mg/dL (ref 6–20)
CALCIUM: 9.2 mg/dL (ref 8.9–10.3)
CO2: 26 mmol/L (ref 22–32)
CREATININE: 0.57 mg/dL (ref 0.44–1.00)
Chloride: 104 mmol/L (ref 101–111)
GLUCOSE: 207 mg/dL — AB (ref 65–99)
Potassium: 3.8 mmol/L (ref 3.5–5.1)
Sodium: 139 mmol/L (ref 135–145)

## 2016-04-16 LAB — CBC
HCT: 36.2 % (ref 36.0–46.0)
HEMOGLOBIN: 12.5 g/dL (ref 12.0–15.0)
MCH: 34.4 pg — ABNORMAL HIGH (ref 26.0–34.0)
MCHC: 34.5 g/dL (ref 30.0–36.0)
MCV: 99.7 fL (ref 78.0–100.0)
PLATELETS: 280 10*3/uL (ref 150–400)
RBC: 3.63 MIL/uL — ABNORMAL LOW (ref 3.87–5.11)
RDW: 13.1 % (ref 11.5–15.5)
WBC: 11.9 10*3/uL — ABNORMAL HIGH (ref 4.0–10.5)

## 2016-04-16 LAB — GLUCOSE, CAPILLARY
Glucose-Capillary: 196 mg/dL — ABNORMAL HIGH (ref 65–99)
Glucose-Capillary: 261 mg/dL — ABNORMAL HIGH (ref 65–99)

## 2016-04-16 MED ORDER — ASPIRIN 81 MG PO TBEC: 81.0000 mg | DELAYED_RELEASE_TABLET | Freq: Every day | ORAL | Status: DC

## 2016-04-16 MED ORDER — BLOOD GLUCOSE MONITOR KIT: PACK | Status: AC

## 2016-04-16 MED ORDER — FENOFIBRATE 160 MG PO TABS: 160.0000 mg | ORAL_TABLET | Freq: Every day | ORAL | Status: AC

## 2016-04-16 MED ORDER — LEVOTHYROXINE SODIUM 50 MCG PO TABS: 50.0000 ug | ORAL_TABLET | Freq: Every day | ORAL | Status: DC

## 2016-04-16 MED ORDER — ATORVASTATIN CALCIUM 80 MG PO TABS: 80.0000 mg | ORAL_TABLET | Freq: Every day | ORAL | Status: DC

## 2016-04-16 MED ORDER — DIAZEPAM 5 MG PO TABS: 5.0000 mg | ORAL_TABLET | Freq: Four times a day (QID) | ORAL | Status: DC | PRN

## 2016-04-16 MED ORDER — TICAGRELOR 90 MG PO TABS: 90.0000 mg | ORAL_TABLET | Freq: Two times a day (BID) | ORAL | Status: DC

## 2016-04-16 MED ORDER — METFORMIN HCL 500 MG PO TABS: 500.0000 mg | ORAL_TABLET | Freq: Two times a day (BID) | ORAL | Status: DC

## 2016-04-16 MED ORDER — INSULIN GLARGINE 100 UNIT/ML ~~LOC~~ SOLN: 10.0000 [IU] | Freq: Every day | SUBCUTANEOUS | Status: AC

## 2016-04-16 NOTE — Discharge Summary (Signed)
Physician Discharge Summary  Abigail [name] [id number] DOB: November 25, 1961 DOA: 04/12/2016  PCP: No PCP Per Patient  Admit date: 04/12/2016 Discharge date: 04/16/2016  Recommendations for Outpatient Follow-up:  1. Pt will need to follow up with PCP in 2-3 weeks post discharge 2. Please obtain BMP to evaluate electrolytes and kidney function 3. Please also check CBC to evaluate Hg and Hct levels 4. Please also reassess glucose control as pt just started on Lantus and Metformin 5. Pt was offered help to set up with PCP but she has declined offer and wants to do that on her own   Discharge Diagnoses:  Principal Problem:   NSTEMI (non-ST elevated myocardial infarction) [facility])  Discharge Condition: Stable  Diet recommendation: Heart healthy diet discussed in details   Brief Narrative:  55 year old female with history of diabetes untreated for over a year, presented for evaluation of left-sided chest pain.  Assessment & Plan:  Active Problems:  Chest pain, NSTEMI in pt with multiple risk factors, medical non compliance  - s/p LHC with severe two-vessel coronary obstructive disease with 85% eccentric proximal LAD stenosis in the region of a small upper takeoff diagonal that had 90% ostial stenosis, followed by 20% LAD stenosis and tubular 80% mid stenosis, severely diseased RCA with 80% ostial stenosis followed by 80% proximal and 99% stenosis with thrombus from ulcerated plaque in the proximal to midsegment.  - successful complex intervention to the RCA including the ostium, proximal ,and mid regions as well as distal stenosis with the distal 85% stenosis being reduced to 0% with insertion of a 2.2520 mm Synergy stent in the mid to ostial stenoses being reduced to 0% with insertion of a 2.538 mm Synergy DES stent. - aggressive risk factor modification. - dual antiplatelet therapy - continue statin, beta blocker, aspirin - cardiac rehabilitationadvic   Diabetes, uncontrolled  with A1C > 13, medical non compliance (HCC) - A1C 13.8 - continue Lantus and Metformin upon discharge    HLD, hypertriglyceridemia  - with cholesterol > 400 - started statin    Hypothyroidism - TSH > 64, order free T4, T3 - start synthroid 50 mcg PO QD - outpatient follow up    Hypokalemia - supplemented    Bipolar 1 disorder (HCC)   Tobacco abuse - continue Nicotine patch   Code Status: Full  Family Communication: Patient at bedside  Disposition Plan: home   Consultants:   Cardiology  Procedures:   Stress test  [facility] 4/29  Antimicrobials:  None  Discharge Exam: Filed Vitals:   04/15/16 2100 04/16/16 0500  BP: 95/54 105/55  Pulse: 87 72  Temp: 98.4 F (36.9 C) 98.2 F (36.8 C)  Resp: 21 20   Filed Vitals:   04/15/16 1445 04/15/16 1623 04/15/16 2100 04/16/16 0500  BP: 102/56 129/61 95/54 105/55  Pulse:  80 87 72  Temp:  98.8 F (37.1 C) 98.4 F (36.9 C) 98.2 F (36.8 C)  TempSrc:  Oral    Resp: 14  21 20   Height:  5' 2"  (1.575 m)    Weight:  57.38 kg (126 lb 8 oz)  56.427 kg (124 lb 6.4 oz)  SpO2: 98% 100% 98% 99%    General: Pt is alert, follows commands appropriately, not in acute distress Cardiovascular: Regular rate and rhythm, S1/S2 +, no murmurs, no rubs, no gallops Respiratory: Clear to auscultation bilaterally, no wheezing, no crackles, no rhonchi Abdominal: Soft, non tender, non distended, bowel sounds +, no guarding Extremities: no edema, no cyanosis, pulses palpable bilaterally  DP and PT Neuro: Grossly nonfocal  Discharge Instructions  Discharge Instructions    Amb Referral to Cardiac Rehabilitation    Complete by:  As directed   Diagnosis:   Coronary Stents NSTEMI       Diet - low sodium heart healthy    Complete by:  As directed      Increase activity slowly    Complete by:  As directed             Medication List    TAKE these medications        aspirin 81 MG EC tablet  Take 1 tablet (81 mg total) by mouth  daily.     atorvastatin 80 MG tablet  Commonly known as:  LIPITOR  Take 1 tablet (80 mg total) by mouth daily at 6 PM.     blood glucose meter kit and supplies Kit  Dispense based on patient and insurance preference. Use up to four times daily as directed. (FOR ICD-9 250.00, 250.01).     diazepam 5 MG tablet  Commonly known as:  VALIUM  Take 1 tablet (5 mg total) by mouth every 6 (six) hours as needed for anxiety (or back pain).     fenofibrate 160 MG tablet  Take 1 tablet (160 mg total) by mouth daily.     insulin glargine 100 UNIT/ML injection  Commonly known as:  LANTUS  Inject 0.1 mLs (10 Units total) into the skin at bedtime.     levothyroxine 50 MCG tablet  Commonly known as:  SYNTHROID, LEVOTHROID  Take 1 tablet (50 mcg total) by mouth daily before breakfast.     metFORMIN 500 MG tablet  Commonly known as:  GLUCOPHAGE  Take 1 tablet (500 mg total) by mouth 2 (two) times daily with a meal.     ticagrelor 90 MG Tabs tablet  Commonly known as:  BRILINTA  Take 1 tablet (90 mg total) by mouth 2 (two) times daily.           Follow-up Information    Call Faye Ramsay, MD.   Specialty:  Internal Medicine   Why:  As needed call my cell phone [phone]   Contact information:   [address] [place] [address] [phone]        The results of significant diagnostics from this hospitalization (including imaging, microbiology, ancillary and laboratory) are listed below for reference.     Microbiology: Recent Results (from the past 240 hour(s))  MRSA PCR Screening     Status: None   Collection Time: 04/14/16  2:24 PM  Result Value Ref Range Status   MRSA by PCR NEGATIVE NEGATIVE Final    Comment:        The GeneXpert MRSA Assay (FDA approved for NASAL specimens only), is one component of a comprehensive MRSA colonization surveillance program. It is not intended to diagnose MRSA infection nor to guide or monitor treatment  for MRSA infections.      Labs: Basic Metabolic Panel:  Recent Labs Lab 04/12/16 2017 04/13/16 0055 04/13/16 0648 04/15/16 0358 04/16/16 0508  NA 136  --  140 139 139  K 3.8  --  3.7 3.4* 3.8  CL 102  --  106 106 104  CO2 24  --  26 23 26   GLUCOSE 362*  --  340* 171* 207*  BUN 10  --  9 <5* 8  CREATININE 0.63 0.62 0.62 0.60 0.57  CALCIUM 9.3  --  9.2 8.9  9.2   Liver Function Tests: No results for input(s): AST, ALT, ALKPHOS, BILITOT, PROT, ALBUMIN in the last 168 hours. No results for input(s): LIPASE, AMYLASE in the last 168 hours. No results for input(s): AMMONIA in the last 168 hours. CBC:  Recent Labs Lab 04/12/16 2017 04/13/16 0055 04/13/16 0648 04/15/16 0358 04/16/16 0508  WBC 9.6 9.7 8.1 12.8* 11.9*  NEUTROABS 4.7  --   --   --   --   HGB 12.6 13.8 13.1 12.5 12.5  HCT 37.2 40.2 38.1 36.9 36.2  MCV 95.9 96.2 97.2 98.7 99.7  PLT 302 324 294 289 280   Cardiac Enzymes:  Recent Labs Lab 04/13/16 0055 04/13/16 0648 04/13/16 1234 04/14/16 1311  TROPONINI 0.10* 0.20* 0.35* 0.27*   BNP: BNP (last 3 results) No results for input(s): BNP in the last 8760 hours.  ProBNP (last 3 results) No results for input(s): PROBNP in the last 8760 hours.  CBG:  Recent Labs Lab 04/15/16 0801 04/15/16 1131 04/15/16 1628 04/15/16 2057 04/16/16 0719  GLUCAP 178* 262* 238* 242* 196*     SIGNED: Time coordinating discharge: Over 30 minutes  Faye Ramsay, MD  [facility] 04/16/2016, 9:03 AM Pager [phone]  If 7PM-7AM, please contact night-coverage [email or web address] Password [email or web address]

## 2016-04-16 NOTE — Discharge Instructions (Signed)
Chest Wall Pain Chest wall pain is pain in or around the bones and muscles of your chest. Sometimes, an injury causes this pain. Sometimes, the cause may not be known. This pain may take several weeks or longer to get better. HOME CARE INSTRUCTIONS  Pay attention to any changes in your symptoms. Take these actions to help with your pain:   Rest as told by your health care provider.   Avoid activities that cause pain. These include any activities that use your chest muscles or your abdominal and side muscles to lift heavy items.   If directed, apply ice to the painful area:  Put ice in a plastic bag.  Place a towel between your skin and the bag.  Leave the ice on for 20 minutes, 2-3 times per day.  Take over-the-counter and prescription medicines only as told by your health care provider.  Do not use tobacco products, including cigarettes, chewing tobacco, and e-cigarettes. If you need help quitting, ask your health care provider.  Keep all follow-up visits as told by your health care provider. This is important. SEEK MEDICAL CARE IF:  You have a fever.  Your chest pain becomes worse.  You have new symptoms. SEEK IMMEDIATE MEDICAL CARE IF:  You have nausea or vomiting.  You feel sweaty or light-headed.  You have a cough with phlegm (sputum) or you cough up blood.  You develop shortness of breath.   This information is not intended to replace advice given to you by your health care provider. Make sure you discuss any questions you have with your health care provider.   Document Released: 12/04/2005 Document Revised: 08/25/2015 Document Reviewed: 03/01/2015 Elsevier Interactive Patient Education 2016 [employer].  Cardiac-Specific Troponin I and T Test WHY AM I HAVING THIS TEST? You may have this test if you have experienced chest pain. The test can be used to determine if you have had a heart attack or injury to heart (cardiac) muscle. This test can also help predict  the possibility of future heart attacks. This test measures the concentration of cardiac-specific troponin in your blood. Troponins are proteins that help muscles contract. There are three forms of troponin, including troponins C, I, and T. The types of troponins I and T that are found in cardiac muscle are different from the troponins I and T that are found in skeletal muscle. Therefore, testing can be done for cardiac-specific troponins I and T. These types of troponin are normally present in very small quantities in the blood. When there is damage to heart muscle cells, cardiac troponins I and T are released into circulation. The more damage there is, the greater the concentration of troponins I and T. When a person has a heart attack, levels of troponin can become elevated in the blood within 3-4 hours after injury and may remain elevated for 10-14 days. WHAT KIND OF SAMPLE IS TAKEN? A blood sample is required for this test. It is usually collected by inserting a needle into a vein. Usually, an initial blood sample is collected, and then another blood sample is collected 12 hours later. After these samples, you will have your blood tested daily for 3-5 days. You might also have it tested weekly for 5-6 weeks. HOW DO I PREPARE FOR THE TEST? There is no preparation required for this test. However, be aware that you will need to make arrangements to have your blood collected frequently.  WHAT ARE THE REFERENCE RANGES? Reference values are considered healthy values established  after testing a large group of healthy people. Reference values may vary among different people, labs, and hospitals. It is your responsibility to obtain your test results. Ask the lab or department performing the test when and how you will get your results. Reference values for cardiac troponins are as follows:  Cardiac troponin T: less than 0.1 ng/mL.  Cardiac troponin I: less than 0.03 ng/mL. WHAT DO THE RESULTS MEAN? Troponin  values above the reference values may indicate:  Injury to the heart muscle.  Heart attack. Talk with your health care provider to discuss your results, treatment options, and if necessary, the need for more tests. Talk with your health care provider if you have any questions about your results.   This information is not intended to replace advice given to you by your health care provider. Make sure you discuss any questions you have with your health care provider.   Document Released: 01/06/2005 Document Revised: 12/25/2014 Document Reviewed: 04/29/2014 Elsevier Interactive Patient Education [employer].

## 2016-04-16 NOTE — Progress Notes (Signed)
SUBJECTIVE:  No complaints  OBJECTIVE:   Vitals:   Filed Vitals:   04/15/16 1445 04/15/16 1623 04/15/16 2100 04/16/16 0500  BP: 102/56 129/61 95/54 105/55  Pulse:  80 87 72  Temp:  98.8 F (37.1 C) 98.4 F (36.9 C) 98.2 F (36.8 C)  TempSrc:  Oral    Resp: 14  21 20   Height:  5\' 2"  (1.575 m)    Weight:  126 lb 8 oz (57.38 kg)  124 lb 6.4 oz (56.427 kg)  SpO2: 98% 100% 98% 99%   I&O's:   Intake/Output Summary (Last 24 hours) at 04/16/16 0800 Last data filed at 04/16/16 0500  Gross per 24 hour  Intake   1020 ml  Output      0 ml  Net   1020 ml   TELEMETRY: Reviewed telemetry pt in NSR:     PHYSICAL EXAM General: Well developed, well nourished, in no acute distress Head: Eyes PERRLA, No xanthomas.   Normal cephalic and atramatic  Lungs:   Clear bilaterally to auscultation and percussion. Heart:   HRRR S1 S2 Pulses are 2+ & equal. Abdomen: Bowel sounds are positive, abdomen soft and non-tender without masses  Extremities:   No clubbing, cyanosis or edema.  DP +1 Neuro: Alert and oriented X 3. Psych:  Good affect, responds appropriately   LABS: Basic Metabolic Panel:  Recent Labs  04/15/16 0358 04/16/16 0508  NA 139 139  K 3.4* 3.8  CL 106 104  CO2 23 26  GLUCOSE 171* 207*  BUN <5* 8  CREATININE 0.60 0.57  CALCIUM 8.9 9.2   Liver Function Tests: No results for input(s): AST, ALT, ALKPHOS, BILITOT, PROT, ALBUMIN in the last 72 hours. No results for input(s): LIPASE, AMYLASE in the last 72 hours. CBC:  Recent Labs  04/15/16 0358 04/16/16 0508  WBC 12.8* 11.9*  HGB 12.5 12.5  HCT 36.9 36.2  MCV 98.7 99.7  PLT 289 280   Cardiac Enzymes:  Recent Labs  04/13/16 1234 04/14/16 1311  TROPONINI 0.35* 0.27*   BNP: Invalid input(s): POCBNP D-Dimer: No results for input(s): DDIMER in the last 72 hours. Hemoglobin A1C: No results for input(s): HGBA1C in the last 72 hours. Fasting Lipid Panel: No results for input(s): CHOL, HDL, LDLCALC, TRIG,  CHOLHDL, LDLDIRECT in the last 72 hours. Thyroid Function Tests: No results for input(s): TSH, T4TOTAL, T3FREE, THYROIDAB in the last 72 hours.  Invalid input(s): FREET3 Anemia Panel: No results for input(s): VITAMINB12, FOLATE, FERRITIN, TIBC, IRON, RETICCTPCT in the last 72 hours. Coag Panel:   Lab Results  Component Value Date   INR 1.01 04/14/2016    RADIOLOGY: Dg Chest 2 View  04/12/2016  CLINICAL DATA:  Chest pain EXAM: CHEST  2 VIEW COMPARISON:  None. FINDINGS: Normal heart size and mediastinal contours. No acute infiltrate or edema. Calcified granuloma in the posterior right costophrenic sulcus. No effusion or pneumothorax. Calcific tendinitis of the right rotator cuff. IMPRESSION: No active cardiopulmonary disease. Calcific tendinitis right rotator cuff. Electronically Signed   By: Monte Fantasia M.D.   On: 04/12/2016 21:10    Assessment/Plan:  Principal Problem:  NSTEMI (non-ST elevated myocardial infarction) [facility]) Active Problems:  Diabetes ([place])  Bipolar 1 disorder (HCC)  Tobacco abuse  History of uterine cancer  Noncompliance  Hypertriglyceridemia  Non-ST elevation myocardial infarction -Peak troponin was 0.35 in the setting of chest pain -Cardiac catheterization on 04/14/16, Dr. Claiborne Billings, complex RCA PCI ostium to mid, complex PCI LAD proximal to mid. There  was no left main involvement. -Dual antiplatelet therapy -Statin, aspirin, Brilinta -Cardiac rehabilitation -BB stopped due to soft BP.  Hopefully can restart as outpt.   Hyperlipidemia -Statin, fibrate -Triglycerides have been greater than 500, uncontrolled diabetes  Hypothyroidism -TSH 65, started on Synthroid  Uncontrolled diabetes -Per primary team.  Tobacco use -Tobacco cessation counseling  Bipolar disorder -She states that this is completely resolved but the daughter said that this has been an ongoing issue and she did have depression recently. Compliant with medication may be an  issue.  Hypokalemia - repleted -3.8 today   Patient is stable and ambulating in hall without any complaints.  Stable for discharge home today.   Sueanne Margarita, MD  04/16/2016  8:00 AM

## 2016-04-17 ENCOUNTER — Encounter (HOSPITAL_COMMUNITY): Payer: Self-pay | Admitting: Cardiovascular Disease

## 2016-04-17 MED FILL — Verapamil HCl IV Soln 2.5 MG/ML: INTRAVENOUS | Qty: 2 | Status: AC

## 2016-04-20 ENCOUNTER — Telehealth: Payer: Self-pay | Admitting: Cardiology

## 2016-04-20 NOTE — Telephone Encounter (Signed)
TCM per [facility]-   5/9 @ 930 w/Lori @ [address]

## 2016-04-20 NOTE — Telephone Encounter (Signed)
TCM call to patient no answer.LMTC. 

## 2016-04-24 ENCOUNTER — Ambulatory Visit
Admission: RE | Admit: 2016-04-24 | Discharge: 2016-04-24 | Disposition: A | Discharge status: Home / Self Care | Payer: BLUE CROSS/BLUE SHIELD | Source: Ambulatory Visit | Attending: Family Medicine | Admitting: Family Medicine

## 2016-04-24 ENCOUNTER — Other Ambulatory Visit: Payer: Self-pay | Admitting: Family Medicine

## 2016-04-24 DIAGNOSIS — M79604 Pain in right leg: Secondary | ICD-10-CM

## 2016-04-25 ENCOUNTER — Other Ambulatory Visit: Payer: Self-pay | Admitting: *Deleted

## 2016-04-25 ENCOUNTER — Encounter: Payer: Self-pay | Admitting: Nurse Practitioner

## 2016-04-25 ENCOUNTER — Ambulatory Visit (INDEPENDENT_AMBULATORY_CARE_PROVIDER_SITE_OTHER): Payer: BLUE CROSS/BLUE SHIELD | Admitting: Nurse Practitioner

## 2016-04-25 VITALS — BP 130/76 | HR 77 | Ht 61.0 in | Wt 124.1 lb

## 2016-04-25 DIAGNOSIS — E0865 Diabetes mellitus due to underlying condition with hyperglycemia: Secondary | ICD-10-CM

## 2016-04-25 DIAGNOSIS — I739 Peripheral vascular disease, unspecified: Secondary | ICD-10-CM

## 2016-04-25 DIAGNOSIS — I10 Essential (primary) hypertension: Secondary | ICD-10-CM | POA: Diagnosis not present

## 2016-04-25 DIAGNOSIS — I214 Non-ST elevation (NSTEMI) myocardial infarction: Secondary | ICD-10-CM | POA: Diagnosis not present

## 2016-04-25 DIAGNOSIS — E0859 Diabetes mellitus due to underlying condition with other circulatory complications: Secondary | ICD-10-CM

## 2016-04-25 DIAGNOSIS — IMO0002 Reserved for concepts with insufficient information to code with codable children: Secondary | ICD-10-CM

## 2016-04-25 DIAGNOSIS — Z955 Presence of coronary angioplasty implant and graft: Secondary | ICD-10-CM | POA: Diagnosis not present

## 2016-04-25 DIAGNOSIS — Z720 Tobacco use: Secondary | ICD-10-CM

## 2016-04-25 DIAGNOSIS — E785 Hyperlipidemia, unspecified: Secondary | ICD-10-CM

## 2016-04-25 LAB — CBC
HCT: 33.1 % — ABNORMAL LOW (ref 35.0–45.0)
Hemoglobin: 11.4 g/dL — ABNORMAL LOW (ref 11.7–15.5)
MCH: 33.5 pg — ABNORMAL HIGH (ref 27.0–33.0)
MCHC: 34.4 g/dL (ref 32.0–36.0)
MCV: 97.4 fL (ref 80.0–100.0)
MPV: 10 fL (ref 7.5–12.5)
Platelets: 484 10*3/uL — ABNORMAL HIGH (ref 140–400)
RBC: 3.4 MIL/uL — ABNORMAL LOW (ref 3.80–5.10)
RDW: 12.8 % (ref 11.0–15.0)
WBC: 11.2 10*3/uL — ABNORMAL HIGH (ref 3.8–10.8)

## 2016-04-25 LAB — BASIC METABOLIC PANEL
BUN: 13 mg/dL (ref 7–25)
CO2: 25 mmol/L (ref 20–31)
Calcium: 9.4 mg/dL (ref 8.6–10.4)
Chloride: 97 mmol/L — ABNORMAL LOW (ref 98–110)
Creat: 0.7 mg/dL (ref 0.50–1.05)
Glucose, Bld: 296 mg/dL — ABNORMAL HIGH (ref 65–99)
Potassium: 4.2 mmol/L (ref 3.5–5.3)
Sodium: 134 mmol/L — ABNORMAL LOW (ref 135–146)

## 2016-04-25 NOTE — Progress Notes (Signed)
CARDIOLOGY OFFICE NOTE  Date:  04/25/2016    Abigail [name] Date of Birth: 01-Jan-1961 Medical Record #[id number]  PCP:  No PCP Per Patient  Cardiologist:  Ellyn Hack  Chief Complaint  Patient presents with  . Coronary Artery Disease  . Hyperlipidemia    Post hospital visit - seen for Dr. Ellyn Hack    History of Present Illness: Abigail [name] is a 55 y.o. female who presents today for a post hospital visit. This was to be a TOC - however phone call was not able to be completed.   She has a history of uncontrolled DM, uterine cancer, bipolar disorder, HLD, and newly found CAD.  She has ongoing tobacco abuse.   She presented towards the end of April with left sided chest pain - NSTEMI - s/p cath with severe two-vessel coronary obstructive disease with 85% eccentric proximal LAD stenosis in the region of a small upper takeoff diagonal that had 90% ostial stenosis, followed by 20% LAD stenosis and tubular 80% mid stenosis, severely diseased RCA with 80% ostial stenosis followed by 80% proximal and 99% stenosis with thrombus from ulcerated plaque in the proximal to midsegment. She underwent successful complex intervention to the RCA including the ostium, proximal ,and mid regions as well as distal stenosis with the distal 85% stenosis being reduced to 0% with insertion of a 2.2520 mm Synergy stent in the mid to ostial stenoses being reduced to 0% with insertion of a 2.538 mm Synergy DES stent.  She was placed on DAPT. She was placed on statin therapy as well. Refused help to get PCP.  Comes back today. Here with sister and brother in law that are visiting from [place]. They are here for the week. She says she is doing ok. No more chest pain. Not short of breath. Limited by leg pain - this was present even before her cath. She is taking her medicines. Has blown a little blood from her nose but no real nose bleeding noted. She says she has been seen at [facility] [place] just a couple of  months ago. Apparently was not taking any medicines or taking care of herself before this cardiac event. She has depression. Asking about disability. Says she has stopped smoking.     Past Medical History  Diagnosis Date  . Diabetes mellitus without complication ([place])   . Cancer ([place])     uterus  . Bipolar disorder ([place])   . Hypertriglyceridemia     Past Surgical History  Procedure Laterality Date  . Abdominal hysterectomy    . Cardiac catheterization N/A 04/14/2016    Procedure: Left Heart Cath and Coronary Angiography;  Surgeon: Troy Sine, MD;  Location: [place] [facility];  Service: Cardiovascular;  Laterality: N/A;  . Cardiac catheterization N/A 04/14/2016    Procedure: Coronary Stent Intervention;  Surgeon: Troy Sine, MD;  Location: [place] [facility];  Service: Cardiovascular;  Laterality: N/A;     Medications: Current Outpatient Prescriptions  Medication Sig Dispense Refill  . aspirin EC 81 MG EC tablet Take 1 tablet (81 mg total) by mouth daily. 30 tablet 1  . atorvastatin (LIPITOR) 80 MG tablet Take 1 tablet (80 mg total) by mouth daily at 6 PM. 30 tablet 1  . blood glucose meter kit and supplies KIT Dispense based on patient and insurance preference. Use up to four times daily as directed. (FOR ICD-9 250.00, 250.01). 1 each 0  . diazepam (VALIUM) 5 MG tablet Take 1 tablet (5  mg total) by mouth every 6 (six) hours as needed for anxiety (or back pain). 30 tablet 0  . fenofibrate 160 MG tablet Take 1 tablet (160 mg total) by mouth daily. 30 tablet 1  . insulin glargine (LANTUS) 100 UNIT/ML injection Inject 0.1 mLs (10 Units total) into the skin at bedtime. 10 mL 11  . levothyroxine (SYNTHROID, LEVOTHROID) 50 MCG tablet Take 1 tablet (50 mcg total) by mouth daily before breakfast. 30 tablet 1  . metFORMIN (GLUCOPHAGE) 500 MG tablet Take 1 tablet (500 mg total) by mouth 2 (two) times daily with a meal. 60 tablet 1  . ticagrelor (BRILINTA) 90 MG TABS tablet Take 1 tablet  (90 mg total) by mouth 2 (two) times daily. 60 tablet 0   No current facility-administered medications for this visit.    Allergies: No Known Allergies  Social History: The patient  reports that she quit smoking today. She has never used smokeless tobacco. She reports that she does not drink alcohol or use illicit drugs.   Family History: The patient's family history is not on file.   Review of Systems: Please see the history of present illness.   Otherwise, the review of systems is positive for none.   All other systems are reviewed and negative.   Physical Exam: VS:  BP 130/76 mmHg  Pulse 77  Ht _0  (1.549 m)  Wt 124 lb 1.9 oz (56.3 kg)  BMI 23.46 kg/m2  SpO2 100% .  BMI Body mass index is 23.46 kg/(m^2).  Wt Readings from Last 3 Encounters:  04/25/16 124 lb 1.9 oz (56.3 kg)  04/16/16 124 lb 6.4 oz (56.427 kg)  04/12/16 136 lb (61.689 kg)    General: Flat affect. She is alert and in no acute distress.  HEENT: Normal. Neck: Supple, no JVD, carotid bruits, or masses noted.  Cardiac: Regular rate and rhythm. No murmurs, rubs, or gallops. No edema. She has palpable pulse in the right foot - none in the left.  Respiratory:  Lungs are clear to auscultation bilaterally with normal work of breathing.  GI: Soft and nontender.  MS: No deformity or atrophy. Gait and ROM intact. Skin: Warm and dry. Color is normal.  Neuro:  Strength and sensation are intact and no gross focal deficits noted.  Psych: Alert, appropriate and with normal affect.   LABORATORY DATA:  EKG:  EKG is not ordered today.  Lab Results  Component Value Date   WBC 11.9* 04/16/2016   HGB 12.5 04/16/2016   HCT 36.2 04/16/2016   PLT 280 04/16/2016   GLUCOSE 207* 04/16/2016   CHOL 403* 04/13/2016   TRIG 526* 04/13/2016   HDL 42 04/13/2016   LDLCALC UNABLE TO CALCULATE IF TRIGLYCERIDE OVER 400 mg/dL 04/13/2016   NA 139 04/16/2016   K 3.8 04/16/2016   CL 104 04/16/2016   CREATININE 0.57 04/16/2016    BUN 8 04/16/2016   CO2 26 04/16/2016   TSH 64.977* 04/13/2016   INR 1.01 04/14/2016   HGBA1C 13.8* 04/13/2016    BNP (last 3 results) No results for input(s): BNP in the last 8760 hours.  ProBNP (last 3 results) No results for input(s): PROBNP in the last 8760 hours.   Other Studies Reviewed Today: Coronary Stent Intervention    Left Heart Cath and Coronary Angiography    Conclusion     Prox RCA lesion, 80% stenosed.  Mid RCA lesion, 99% stenosed.  Mid RCA to Dist RCA lesion, 20% stenosed.  2nd Diag lesion, 90%  stenosed.  Dist RCA lesion, 85% stenosed. Post intervention, there is a 0% residual stenosis.  Ost RCA to Prox RCA lesion, 80% stenosed. Post intervention, there is a 0% residual stenosis.  Dist LAD lesion, 80% stenosed. Post intervention, there is a 0% residual stenosis.  Mid LAD lesion, 85% stenosed. Post intervention, there is a 0% residual stenosis.  Severe two-vessel coronary obstructive disease with a normal left main; 85% eccentric proximal LAD stenosis in the region of a small upper takeoff diagonal that had 90% ostial stenosis, followed by 20% LAD stenosis and tubular 80% mid stenosis; normal ramus intermediate vessel, normal left circumflex vessel, and severely diseased RCA with 80% ostial stenosis followed by 80% proximal and 99% stenosis with thrombus from ulcerated plaque in the proximal to midsegment. There is 20% narrowing at the acute margin followed by long 85% stenosis in the distal RCA.  Difficult but successful complex intervention to the RCA including the ostium, proximal ,and mid regions as well as distal stenosis with the distal 85% stenosis being reduced to 0% with insertion of a 2.2520 mm Synergy stent in the mid to ostial stenoses being reduced to 0% with insertion of a 2.538 mm Synergy DES stent.  Successful PCI to the LAD with a complex proximal stenosis of 85% and 90% stenosis in the sharp upper takeoff small diagonal vessel with long  80% tubular mid LAD stenoses. The mid LAD, 80% stenosis was reduced to 0% with insertion of a 3.024 mm Synergy stent in the proximal LAD, 85% stenosis was reduced to 0% with insertion of a 3.012 mm Synergy DES stent. There was absence antegrade flow in this small diagonal vessel with evidence for collaterals following post stent dilatation proximally.  RECOMMENDATION: The patient will continue with bivalirudin for 4 hours post procedure in attempt to restore antegrade flow down the diagonal vessel. She will required to DAPT, and aggressive risk factor modification.    Echo Study Conclusions from 03/2016  - Left ventricle: The cavity size was normal. Systolic function was  normal. The estimated ejection fraction was in the range of 55%  to 60%. Wall motion was normal; there were no regional wall  motion abnormalities. Left ventricular diastolic function  parameters were normal. - Aortic valve: There was trivial regurgitation. - Left atrium: The atrium was mildly dilated.  Assessment/Plan: 1. NSTEMI with cardiac cath and 2 vessel PCI to RCA and LAD - on DAPT therapy - needs aggressive CV risk factor modification - especially needs diabetic control. Recheck her lab today. Asking about disability - do not feel she will qualify based on normal EF.   2. Uncontrolled DM -  Try to get back to [facility] and get diabetes management.   3. HLD - now on statin - will need labs on return  4. Tobacco abuse -  Has stopped.   5. HTN - BP ok on current regimen.   6. Leg pain?claudication - will get lower extremity doppler study.   7. Minor nose bleeding with blowing - encouraged to use saline spray to keep the nostrils moist.   8. Depression/bipolar   Current medicines are reviewed with the patient today.  The patient does not have concerns regarding medicines other than what has been noted above.  The following changes have been made:  See above.  Labs/ tests ordered today include:    No orders of the defined types were placed in this encounter.     Disposition:   FU with Dr. Ellyn Hack in  2 to 4 weeks. Trying to get her to primary care. Lab today.   Patient is agreeable to this plan and will call if any problems develop in the interim.   Signed: Burtis Junes, RN, ANP-C 04/25/2016 9:55 AM  [facility] [address] [place], [place]  [address] Phone: [phone] Fax: [phone]

## 2016-04-25 NOTE — Patient Instructions (Addendum)
We will be checking the following labs today - BMET and CBC   Medication Instructions:    Continue with your current medicines.   Use nasal saline spray to keep your nostrils moist    Testing/Procedures To Be Arranged:  Lower extremity arterial doppler studies - for claudication  Follow-Up:   See Dr. Ellyn Hack in 2 to 4 weeks - he is at the [facility] office - with fasting labs  Please call [facility] - to get her an appointment - she has been seen there before - needs diabetes management.    Other Special Instructions:   Congrats for not smoking!  Make sure you take your medicines every day - follow this list    If you need a refill on your cardiac medications before your next appointment, please call your pharmacy.   Call the [place] office at [phone] if you have any questions, problems or concerns.

## 2016-04-26 NOTE — Telephone Encounter (Signed)
Pt saw Truitt Merle on 05/09

## 2016-04-27 ENCOUNTER — Other Ambulatory Visit: Payer: Self-pay | Admitting: Nurse Practitioner

## 2016-04-27 ENCOUNTER — Ambulatory Visit (HOSPITAL_COMMUNITY)
Admission: RE | Admit: 2016-04-27 | Discharge: 2016-04-27 | Disposition: A | Discharge status: Home / Self Care | Payer: BLUE CROSS/BLUE SHIELD | Source: Ambulatory Visit | Attending: Cardiovascular Disease | Admitting: Cardiovascular Disease

## 2016-04-27 DIAGNOSIS — E781 Pure hyperglyceridemia: Secondary | ICD-10-CM | POA: Insufficient documentation

## 2016-04-27 DIAGNOSIS — E119 Type 2 diabetes mellitus without complications: Secondary | ICD-10-CM | POA: Insufficient documentation

## 2016-04-27 DIAGNOSIS — I739 Peripheral vascular disease, unspecified: Secondary | ICD-10-CM | POA: Insufficient documentation

## 2016-04-27 DIAGNOSIS — E0859 Diabetes mellitus due to underlying condition with other circulatory complications: Secondary | ICD-10-CM | POA: Diagnosis not present

## 2016-04-27 DIAGNOSIS — Z720 Tobacco use: Secondary | ICD-10-CM

## 2016-04-27 DIAGNOSIS — Z955 Presence of coronary angioplasty implant and graft: Secondary | ICD-10-CM

## 2016-04-27 DIAGNOSIS — E785 Hyperlipidemia, unspecified: Secondary | ICD-10-CM

## 2016-04-27 DIAGNOSIS — I214 Non-ST elevation (NSTEMI) myocardial infarction: Secondary | ICD-10-CM

## 2016-04-27 DIAGNOSIS — I10 Essential (primary) hypertension: Secondary | ICD-10-CM

## 2016-04-27 DIAGNOSIS — E0865 Diabetes mellitus due to underlying condition with hyperglycemia: Secondary | ICD-10-CM

## 2016-04-27 DIAGNOSIS — IMO0002 Reserved for concepts with insufficient information to code with codable children: Secondary | ICD-10-CM

## 2016-05-03 ENCOUNTER — Encounter: Payer: Self-pay | Admitting: *Deleted

## 2016-05-18 ENCOUNTER — Ambulatory Visit: Payer: BLUE CROSS/BLUE SHIELD | Admitting: Cardiology

## 2016-05-19 ENCOUNTER — Other Ambulatory Visit: Payer: Self-pay | Admitting: *Deleted

## 2016-05-19 ENCOUNTER — Other Ambulatory Visit: Payer: Self-pay

## 2016-05-19 MED ORDER — TICAGRELOR 90 MG PO TABS: 90.0000 mg | ORAL_TABLET | Freq: Two times a day (BID) | ORAL | Status: DC

## 2016-05-19 NOTE — Telephone Encounter (Signed)
Rx(s) sent to pharmacy electronically.  

## 2016-06-19 ENCOUNTER — Telehealth: Payer: Self-pay | Admitting: Cardiology

## 2016-06-19 MED ORDER — TICAGRELOR 90 MG PO TABS: 90.0000 mg | ORAL_TABLET | Freq: Two times a day (BID) | ORAL | Status: DC

## 2016-06-19 NOTE — Telephone Encounter (Signed)
Brilinta refill sent.

## 2016-06-19 NOTE — Telephone Encounter (Signed)
*  STAT* If patient is at the pharmacy, call can be transferred to refill team.   1. Which medications need to be refilled? (please list name of each medication and dose if known) Brellinta   2. Which pharmacy/location (including street and city if local pharmacy) is medication to be sent to? [facility]- [place]- [address]  3. Do they need a 30 day or 90 day supply? [place]

## 2017-02-02 ENCOUNTER — Encounter: Payer: Self-pay | Admitting: Physician Assistant

## 2017-02-02 ENCOUNTER — Ambulatory Visit (INDEPENDENT_AMBULATORY_CARE_PROVIDER_SITE_OTHER): Payer: BLUE CROSS/BLUE SHIELD | Admitting: Physician Assistant

## 2017-02-02 VITALS — BP 132/78 | HR 71 | Ht 61.0 in | Wt 131.0 lb

## 2017-02-02 DIAGNOSIS — Z79899 Other long term (current) drug therapy: Secondary | ICD-10-CM

## 2017-02-02 DIAGNOSIS — I2510 Atherosclerotic heart disease of native coronary artery without angina pectoris: Secondary | ICD-10-CM

## 2017-02-02 DIAGNOSIS — E1169 Type 2 diabetes mellitus with other specified complication: Secondary | ICD-10-CM | POA: Diagnosis not present

## 2017-02-02 DIAGNOSIS — I252 Old myocardial infarction: Secondary | ICD-10-CM

## 2017-02-02 DIAGNOSIS — E781 Pure hyperglyceridemia: Secondary | ICD-10-CM

## 2017-02-02 LAB — HEPATIC FUNCTION PANEL
ALBUMIN: 4.4 g/dL (ref 3.6–5.1)
ALT: 35 U/L — ABNORMAL HIGH (ref 6–29)
AST: 30 U/L (ref 10–35)
Alkaline Phosphatase: 62 U/L (ref 33–130)
BILIRUBIN TOTAL: 0.4 mg/dL (ref 0.2–1.2)
Bilirubin, Direct: 0.1 mg/dL (ref <=0.2)
Indirect Bilirubin: 0.3 mg/dL (ref 0.2–1.2)
Total Protein: 7.5 g/dL (ref 6.1–8.1)

## 2017-02-02 LAB — LIPID PANEL
CHOL/HDL RATIO: 3.8 ratio (ref <5.0)
Cholesterol: 164 mg/dL (ref <200)
HDL: 43 mg/dL — AB (ref >50)
LDL CALC: 91 mg/dL (ref <100)
TRIGLYCERIDES: 151 mg/dL — AB (ref <150)
VLDL: 30 mg/dL (ref <30)

## 2017-02-02 NOTE — Patient Instructions (Addendum)
Medication Instructions:  Your physician recommends that you continue on your current medications as directed. Please refer to the Current Medication list given to you today.  Labwork: Please have FASTING lab work today (lipid panel, hepatic function)  Testing/Procedures: NONE  Follow-Up: Friday, June 1st at [facility] with Dr. Ellyn Hack at [facility] office.   Any Other Special Instructions Will Be Listed Below (If Applicable).     If you need a refill on your cardiac medications before your next appointment, please call your pharmacy.

## 2017-02-02 NOTE — Progress Notes (Signed)
Cardiology Office Note    Date:  02/02/2017   ID:  Abigail [name], DOB 1960/12/29, MRN [id number]  PCP:  Kizzie Fantasia, NP  Cardiologist:  Dr. Ellyn Hack  Chief Complaint  Patient presents with  . Follow-up    seen for Dr. Ellyn Hack, NSTEMI in 03/2016, delayed followup    History of Present Illness:  Abigail [name] is a 56 y.o. female with PMH of uncontrolled DM, uterine cancer, bipolar disorder, HLD, tobacco abuse and CAD. She was admitted in late April with NSTEMI. She underwent cardiac cath which revealed severe 2 vessel CAD with 85% eccentric proximal LAD stenosis in the region of a small upper takeoff diagonal that had 90% ostial stenosis, followed by 20% LAD stenosis and a tubular 80% mid stenosis, severely diseased RCA with 80% ostial stenosis followed by 80% proximal and 99% stenosis with thrombus from ulcerated plaque in the proximal to mid segment. She underwent complex intervention to the RCA including the ostium, proximal and mid region and the distal region with 2.25 x 20 mm Synergy DES in the distal portion and 2.5 x 38 mm Synergy DES in the proximal portion. As for her LAD disease, she was treated with 3.0 x 24 mm Synergy DES in mid LAD and 3.0 x 12 mm Synergy DES in proximal LAD portion. Post cath, she was placed on high intensity statin, aspirin and Brilinta.  She was seen by Denice Bors NP on 04/25/2016 as post hospital follow-up. She was doing well at the time without any chest pain. However she was having leg pain which was present even prior to the cath. ABI and TBI obtained on 04/27/2016 were normal. She was supposed to follow-up with Dr. Ellyn Hack in a month, however she has not returned since. She presents today for office follow-up. She says she had a lot of issue going on last year therefore failed to follow-up since. She has not smoked since her heart attack. She has been compliant with her aspirin and Brilinta to this day. She denies ever having any recurrent  chest pain like what she experienced during her heart attack. She does walk with a cane therefore does not do much strenuous activity. Since she was placed on Lipitor, she has not had any repeat blood work. I will check a fasting panel and LFTs today. I further emphasized the need to be compliant with dual antiplatelet therapy and to avoid tobacco. I also discussed her coronary anatomy.   Past Medical History:  Diagnosis Date  . Bipolar disorder ([place])   . Cancer ([place])    uterus  . Diabetes mellitus without complication ([place])   . Hypertriglyceridemia     Past Surgical History:  Procedure Laterality Date  . ABDOMINAL HYSTERECTOMY    . CARDIAC CATHETERIZATION N/A 04/14/2016   Procedure: Left Heart Cath and Coronary Angiography;  Surgeon: Troy Sine, MD;  Location: [place] [facility];  Service: Cardiovascular;  Laterality: N/A;  . CARDIAC CATHETERIZATION N/A 04/14/2016   Procedure: Coronary Stent Intervention;  Surgeon: Troy Sine, MD;  Location: [place] [facility];  Service: Cardiovascular;  Laterality: N/A;    Current Medications: Outpatient Medications Prior to Visit  Medication Sig Dispense Refill  . aspirin EC 81 MG EC tablet Take 1 tablet (81 mg total) by mouth daily. 30 tablet 1  . atorvastatin (LIPITOR) 80 MG tablet Take 1 tablet (80 mg total) by mouth daily at 6 PM. 30 tablet 1  . blood glucose meter kit and supplies  KIT Dispense based on patient and insurance preference. Use up to four times daily as directed. (FOR ICD-9 250.00, 250.01). 1 each 0  . diazepam (VALIUM) 5 MG tablet Take 1 tablet (5 mg total) by mouth every 6 (six) hours as needed for anxiety (or back pain). 30 tablet 0  . fenofibrate 160 MG tablet Take 1 tablet (160 mg total) by mouth daily. 30 tablet 1  . insulin glargine (LANTUS) 100 UNIT/ML injection Inject 0.1 mLs (10 Units total) into the skin at bedtime. (Patient taking differently: Inject 14-16 Units into the skin at bedtime. ) 10 mL 11  . ticagrelor  (BRILINTA) 90 MG TABS tablet Take 1 tablet (90 mg total) by mouth 2 (two) times daily. 60 tablet 2  . levothyroxine (SYNTHROID, LEVOTHROID) 50 MCG tablet Take 1 tablet (50 mcg total) by mouth daily before breakfast. (Patient not taking: Reported on 02/02/2017) 30 tablet 1  . metFORMIN (GLUCOPHAGE) 500 MG tablet Take 1 tablet (500 mg total) by mouth 2 (two) times daily with a meal. (Patient not taking: Reported on 02/02/2017) 60 tablet 1   No facility-administered medications prior to visit.      Allergies:   Patient has no known allergies.   Social History   Social History  . Marital status: Divorced    Spouse name: N/A  . Number of children: N/A  . Years of education: N/A   Social History Main Topics  . Smoking status: Former Smoker    Quit date: 04/25/2016  . Smokeless tobacco: Never Used     Comment: quit smoking in Apr 2017  . Alcohol use No  . Drug use: No  . Sexual activity: Not Asked   Other Topics Concern  . None   Social History Narrative  . None     Family History:  The patient's family history includes CAD in her mother; Heart attack in her maternal grandfather and paternal grandfather; Heart disease in her father; Prostate cancer in her father.   ROS:   Please see the history of present illness.    ROS All other systems reviewed and are negative.   PHYSICAL EXAM:   VS:  BP 132/78   Pulse 71   Ht _0  (1.549 m)   Wt 131 lb (59.4 kg)   BMI 24.75 kg/m    GEN: Well nourished, well developed, in no acute distress  HEENT: normal  Neck: no JVD, carotid bruits, or masses Cardiac: RRR; no murmurs, rubs, or gallops,no edema  Respiratory:  clear to auscultation bilaterally, normal work of breathing GI: soft, nontender, nondistended, + BS MS: no deformity or atrophy  Skin: warm and dry, no rash Neuro:  Alert and Oriented x 3, Strength and sensation are intact Psych: euthymic mood, full affect  Wt Readings from Last 3 Encounters:  02/02/17 131 lb (59.4 kg)   04/25/16 124 lb 1.9 oz (56.3 kg)  04/16/16 124 lb 6.4 oz (56.4 kg)      Studies/Labs Reviewed:   EKG:  EKG is ordered today.  The ekg ordered today demonstrates Sinus rhythm without significant ST-T wave changes.  Recent Labs: 04/13/2016: TSH 64.977 04/25/2016: BUN 13; Creat 0.70; Hemoglobin 11.4; Platelets 484; Potassium 4.2; Sodium 134 02/02/2017: ALT 35   Lipid Panel    Component Value Date/Time   CHOL 164 02/02/2017 1009   TRIG 151 (H) 02/02/2017 1009   HDL 43 (L) 02/02/2017 1009   CHOLHDL 3.8 02/02/2017 1009   VLDL 30 02/02/2017 1009   LDLCALC 91 02/02/2017  1009    Additional studies/ records that were reviewed today include:   ABI and TBI 04/27/2016 Impressions No evidence of segmental lower extremity arterial disease at rest, bilaterally. Normal ABI's, bilaterally. Normal great toe-brachial indices, bilaterally    Cath 04/14/2016  Conclusion    Prox RCA lesion, 80% stenosed.  Mid RCA lesion, 99% stenosed.  Mid RCA to Dist RCA lesion, 20% stenosed.  2nd Diag lesion, 90% stenosed.  Dist RCA lesion, 85% stenosed. Post intervention, there is a 0% residual stenosis.  Ost RCA to Prox RCA lesion, 80% stenosed. Post intervention, there is a 0% residual stenosis.  Dist LAD lesion, 80% stenosed. Post intervention, there is a 0% residual stenosis.  Mid LAD lesion, 85% stenosed. Post intervention, there is a 0% residual stenosis.   Severe two-vessel coronary obstructive disease with a normal left main; 85% eccentric proximal LAD stenosis in the region of a small upper takeoff diagonal that had 90% ostial stenosis, followed by 20% LAD stenosis and tubular 80% mid stenosis; normal ramus intermediate vessel, normal left circumflex vessel, and severely diseased RCA with 80% ostial stenosis followed by 80% proximal and 99% stenosis with thrombus from ulcerated plaque in the proximal to midsegment.  There is 20% narrowing at the acute margin followed by long 85% stenosis in  the distal RCA.  Difficult but successful complex intervention to the RCA including the ostium, proximal ,and mid regions as well as distal stenosis with the distal 85% stenosis being reduced to 0% with insertion of a 2.2520 mm Synergy stent in the mid to ostial stenoses being reduced to 0% with insertion of a 2.538 mm Synergy DES stent.  Successful PCI to the LAD with a complex proximal stenosis of 85% and 90% stenosis in the sharp upper takeoff small diagonal vessel with long 80% tubular mid LAD stenoses.  The mid LAD, 80% stenosis was reduced to 0% with insertion of a 3.024 mm Synergy stent in the proximal LAD, 85% stenosis was reduced to 0% with insertion of a 3.012 mm Synergy DES stent.  There was absence antegrade flow in this small diagonal vessel with evidence for collaterals following post stent dilatation proximally.      ASSESSMENT:    1. Coronary artery disease involving native coronary artery of native heart without angina pectoris   2. Hypertriglyceridemia   3. Hx of non-ST elevation myocardial infarction (NSTEMI)   4. Medication management   5. Controlled type 2 diabetes mellitus with other specified complication, without long-term current use of insulin (HCC)      PLAN:  In order of problems listed above:  1. CAD s/p DES 2 to RCA and the DES 2 to LAD: Continue 80 mg Lipitor, Continue aspirin and Brilinta. She has not had any recent angina and she has been compliant with her medication. Fortunately she also has stopped smoking since April of last year.  2. Hyperlipidemia: She is currently on high-dose statin, she is fasting today, we will obtain fasting lipid panel and LFT  3. DM II: We'll defer management to PCP.    Medication Adjustments/Labs and Tests Ordered: Current medicines are reviewed at length with the patient today.  Concerns regarding medicines are outlined above.  Medication changes, Labs and Tests ordered today are listed in the Patient Instructions  below. Patient Instructions  Medication Instructions:  Your physician recommends that you continue on your current medications as directed. Please refer to the Current Medication list given to you today.  Labwork: Please have FASTING lab work  today (lipid panel, hepatic function)  Testing/Procedures: NONE  Follow-Up: Friday, June 1st at [facility] with Dr. Ellyn Hack at [facility] office.   Any Other Special Instructions Will Be Listed Below (If Applicable).     If you need a refill on your cardiac medications before your next appointment, please call your pharmacy.      Hilbert Corrigan, [place]  02/02/2017 6:29 PM    [place], [place], [place]  [address] Phone: [phone]; Fax: [phone]

## 2017-02-05 ENCOUNTER — Telehealth: Payer: Self-pay | Admitting: *Deleted

## 2017-02-05 NOTE — Telephone Encounter (Signed)
Left msg to call.

## 2017-02-05 NOTE — Telephone Encounter (Signed)
-----   Message from [place], [place] sent at 02/02/2017  6:32 PM EST ----- Lipid profile has significantly improved compare to 9 month ago. Cholesterol improved from 403 to 164. Triglyceride improved from 526 to 151. Liver function stable. Continue current dose of lipitor.

## 2017-02-13 ENCOUNTER — Encounter: Payer: Self-pay | Admitting: *Deleted

## 2017-04-29 ENCOUNTER — Other Ambulatory Visit: Payer: Self-pay | Admitting: Cardiovascular Disease

## 2017-04-30 NOTE — Telephone Encounter (Signed)
Rx has been sent to the pharmacy electronically. ° °

## 2017-05-18 ENCOUNTER — Ambulatory Visit (INDEPENDENT_AMBULATORY_CARE_PROVIDER_SITE_OTHER): Payer: BLUE CROSS/BLUE SHIELD | Admitting: Cardiology

## 2017-05-18 DIAGNOSIS — I2510 Atherosclerotic heart disease of native coronary artery without angina pectoris: Secondary | ICD-10-CM | POA: Diagnosis not present

## 2017-05-18 DIAGNOSIS — Z720 Tobacco use: Secondary | ICD-10-CM | POA: Diagnosis not present

## 2017-05-18 DIAGNOSIS — I214 Non-ST elevation (NSTEMI) myocardial infarction: Secondary | ICD-10-CM | POA: Diagnosis not present

## 2017-05-18 DIAGNOSIS — E785 Hyperlipidemia, unspecified: Secondary | ICD-10-CM

## 2017-05-18 DIAGNOSIS — Z9861 Coronary angioplasty status: Secondary | ICD-10-CM | POA: Diagnosis not present

## 2017-05-18 NOTE — Patient Instructions (Addendum)
Medication  Stop aspirin     You have been referred to [facility] NOT TO [facility]AL   Your physician wants you to follow-up in [facility].You will receive a reminder letter in the mail two months in advance. If you don't receive a letter, please call our office to schedule the follow-up appointment.   If you need a refill on your cardiac medications before your next appointment, please call your pharmacy.

## 2017-05-18 NOTE — Progress Notes (Signed)
PCP: Lois Huxley, PA  Clinic Note: Chief Complaint  Patient presents with  . Follow-up    3 month rov - multivessel (2) CAD with 4 stents    HPI: Abigail [name] is a 56 y.o. female with a PMH below who presents today for 3 month Follow-up for CAD. I first and last saw her in April 2017 when she presented with a non-ST elevation MI. I saw her in consultation. She was referred for cardiac catheterization that revealed significant disease in the RCA and LAD. - She had 2 lesions each in the RCA and LAD treated with a total of 4 stents.  Tylene Quashie was last seen on 02/02/2017 by Almyra Deforest, PA for very delayed follow-up visit. Her last visit prior to that was on 04/25/2016 by Glean Hess, NP. She was having leg pain and was evaluated with a largely Doppler study that showed normal flow. Several social issues Are from being able to come back for follow-up visits. She does indicate that she has been a client with her aspirin and Brilinta and at the time denied any recurrent chest pain symptoms similar to MI. She has been walking with a cane for several years now due to right leg weakness and neuropathy.   - She also indicated having successfully quit smoking at the time of her MI and has not gone back to cigarettes.  Recent Hospitalizations: None  Studies Personally Reviewed - (if available, images/films reviewed: From Epic Chart or Care Everywhere)  Cardiac Catheterization-PCI 04/06/2016 (Dr. Claiborne Billings): 2 Vessel / 4 Site PCI  PCI of ostial 80%, proximal 80% & mid RCA 99% stenosis: Covered with one single Synergy DES 2.5 mm 38 mm   PCI of mid-distal RCA 85% stenosis: Synergy DES 2.25 mm x 20 mm  PCI of Mid LAD 80%: Synergy DES 3.0 mm 12 mm - postdilated to 3.3 mm; loss of antegrade flow in small caliber proximal diagonal 1  PCI of Dist LAD 85%: Synergy DES 3.0 mm 24 mm - postdilated to 3.3 mm Diagnostic Diagram       Post-Intervention Diagram            Echo 03/2016: Normal  LV size and function. EF 55-60%. Normal diastolic parameters. Mild LA dilation. Trivial AI  LEA ABIs & Doppler: Bilateral ABI 1.1, normal TBI.  Interval History: Prestina presents today overall stable from a cardiac standpoint. She says her labs were again checked by PCP after being checked when she saw Mr. Eulas Post. Unfortunately I do not have these results. She notes a little bit of bruising and oozing at wound sites since being on aspirin plus Brilinta, but has been stable. She asked about the need to be on dual antiplatelet therapy and I explained to her with the extent of CAD and stents that they would be best for her to stay on some type of Thienopyridine type medication. We could potentially reduce Brilinta's to 60 mg dose if bleeding is worse.  She is not having any chest tightness or pressure since her PCI. She is quite familiar know whether anginal symptom is which wasn't heavy pressure sensation across her chest during which she could not breathe. She has not had anything like that. She is not able to be very active because walking with a cane, but with with the activity she does she is not having any exertional symptoms of chest tightness or dyspnea.  No heart failure symptoms of PND, orthopnea or edema. No palpitations, lightheadedness, dizziness, weakness or  syncope/near syncope. No TIA/amaurosis fugax symptoms. No melena, hematochezia, hematuria, or epstaxis. No claudication.  ROS: A comprehensive was performed. Review of Systems  Constitutional: Negative for malaise/fatigue.  Respiratory: Negative for cough, shortness of breath and wheezing.   Musculoskeletal: Positive for joint pain (Right hip and back) and myalgias (Right leg; pre-dates starting statin therapy). Negative for falls.  Neurological: Positive for focal weakness (Chronic/persistent weakness of the right hip and leg. Possibly radicular. Walks with a cane.).  Psychiatric/Behavioral: Negative for depression and memory  loss. The patient is nervous/anxious.    I have reviewed and (if needed) personally updated the patient's problem list, medications, allergies, past medical and surgical history, social and family history.   Past Medical History:  Diagnosis Date  . Bipolar disorder ([place])   . CAD S/P percutaneous coronary angioplasty 04/06/2016   2 Vessel CAD: Ost-prox-mid RCA (80%, 80% & 99%) -> Synergy DES 2.5 x 38, mid-dist RCA -> Synergy DES 2.25 x 20.  mid LAD 80% -> Synergy DES 3.0 x 12 (3.3 mm), dist LAD 85%, D1 occluded-> Synergy DES 3.0 x 12 (3.3 mm)   . Cancer ([place])    uterus  . Diabetes mellitus without complication ([place])   . Hypertriglyceridemia   . Non-STEMI (non-ST elevated myocardial infarction) ([place]) 03/2016   Found to have severe 2 vessel disease involving the ostial to mid RCA and then mid to distal RCA as well as mid and distal LAD. - 4 site PCI    Past Surgical History:  Procedure Laterality Date  . ABDOMINAL HYSTERECTOMY    . CARDIAC CATHETERIZATION N/A 04/14/2016   Procedure: Left Heart Cath and Coronary Angiography;  Surgeon: Troy Sine, MD;  Location: [facility];  Service: Cardiovascular: NSTEMI -> ost RCA 80%, prox RCA 80%, mid RCA 99%, mid-dist RCA 85% (pre bifurcation). Mid LAD-Diag1 85% (with D1 85% - occluded post PCI of LAD, small caliber), dist LAD 85%.  [name] CARDIAC CATHETERIZATION N/A 04/14/2016   Procedure: Coronary Stent Intervention;  Surgeon: Troy Sine, MD;  Location: [facility];  Service: Cardiovascular: 2 Vessel, 2 non-overlapping stents each: ost-mid RCA (3 lesions) - Synergy DES 2.5 mm x 38 mm, mid-dist RCA - Syergy DES 2.25 mm x 0 mm. mid LAD 3.0 mm x 12 mm (3.3 mm), dist LAD Synergy DES 3.0 mm x 24 mm (3.3 mm)  . TRANSTHORACIC ECHOCARDIOGRAM  03/2016   Post NSTEMI:  Normal LV size and function. EF 55-60%. Normal diastolic parameters. Mild LA dilation. Trivial AI    No outpatient prescriptions have been marked as taking for the 05/18/17 encounter  (Office Visit) with Leonie Man, MD.    No Known Allergies  Social History   Social History  . Marital status: Divorced    Spouse name: N/A  . Number of children: N/A  . Years of education: N/A   Social History Main Topics  . Smoking status: Former Smoker    Quit date: 04/25/2016  . Smokeless tobacco: Never Used     Comment: quit smoking in Apr 2017  . Alcohol use No  . Drug use: No  . Sexual activity: Not Asked   Other Topics Concern  . None   Social History Narrative  . None    family history includes CAD in her mother; Heart attack in her maternal grandfather and paternal grandfather; Heart disease in her father; Prostate cancer in her father.  Wt Readings from Last 3 Encounters:  05/18/17 132 lb 9.6 oz (60.1  kg)  02/02/17 131 lb (59.4 kg)  04/25/16 124 lb 1.9 oz (56.3 kg)    PHYSICAL EXAM BP 136/72   Pulse 78   Ht 5\' 1"  (1.549 m)   Wt 132 lb 9.6 oz (60.1 kg)   BMI 25.05 kg/m  General appearance: alert, cooperative, appears stated age, no distress. Well-nourished and well-groomed. Pleasant mood and affect. HEENT: [place]/AT, EOMI, MMM, anicteric sclera Neck: no adenopathy, no carotid bruit and no JVD Lungs: clear to auscultation bilaterally, normal percussion bilaterally and non-labored Heart: regular rate and rhythm, S1 &S2 normal, no murmur, click, rub or gallop; nondisplaced PMI Abdomen: soft, non-tender; bowel sounds normal; no masses,  no organomegaly; no HJR Extremities: extremities normal, atraumatic, no cyanosis, or edema  Pulses: 2+ and symmetric;  Skin: mobility and turgor normal, no evidence of bleeding or bruising, temperature normal and texture normal Neurologic: Mental status: Alert & oriented x 3, thought content appropriate; she does have an antalgic gait favoring her right leg, use a cane.   Adult ECG Report n/a  Other studies Reviewed: Additional studies/ records that were reviewed today include:  Recent Labs:     Lab Results    Component Value Date   CHOL 164 02/02/2017   HDL 43 (L) 02/02/2017   LDLCALC 91 02/02/2017   TRIG 151 (H) 02/02/2017   CHOLHDL 3.8 02/02/2017    Lab Results  Component Value Date   CREATININE 0.70 04/25/2016   BUN 13 04/25/2016   NA 134 (L) 04/25/2016   K 4.2 04/25/2016   CL 97 (L) 04/25/2016   CO2 25 04/25/2016    ASSESSMENT / PLAN: Problem List Items Addressed This Visit    CAD S/P percutaneous coronary angioplasty (Chronic)    4 site PCI with 4 stents in 2 vessels. DES stents. No further anginal symptoms. She is now beyond 1 year from her PCI and we can stop aspirin will continue Brilinta. If she is having some bruising issues we can reduce Brilinta dose to 60 mg maintenance dose versus convert to Plavix if financially more feasible. Maintain her on [place] because of her extensive stent work. Also with her being a diabetic, she is more likely to have in-stent restenosis issues.  Plan: DC aspirin and continue Brilinta. Not currently on beta blocker or ACE inhibitor with normal blood pressure. We'll reassess in follow-up and determine if he could potentially start ACE inhibitor with her being a diabetic. She is on high-dose statin plus fenofibrate. As far as I can tell this is been for some time. Her most recent labs showed notable improvement of her triglycerides that are now essentially at goal, but her LDL is still not at goal. --> We need to get labs were PCP to determine if is been any improvement.  I referred her to our Cardiovascular Risk Reduction Clinic ([facility]) run by our clinical pharmacists. [facility]. They can discuss potentially converting from fenofibrate to Zetia versus simply adding Zetia versus considering PCSK9 inhibitor. Goal LDL should be less than 70 with her extensive disease and risk factors of diabetes      Hyperlipidemia with target low density lipoprotein (LDL) cholesterol less than 70 mg/dL (Chronic)    Notably improved triglycerides  while on fenofibrate. Now essentially at goal. LDL still not at goal however. Referring to CVRR clinic      NSTEMI (non-ST elevated myocardial infarction) ([place]) (Chronic)    She had non-STEMI with probably the culprit lesion being the RCA mid lesion. For total stents placed.  No further anginal symptoms. No heart failure symptoms. Preserved LVEF with normal diastolic function. She is as active as she is able to be. Now here for stable follow-up      Tobacco abuse (Chronic)    I congratulated her on having quit smoking at the time of her MI and not going back to it. This removes 1 major risk factor.         Current medicines are reviewed at length with the patient today. (+/- concerns) Need to have labs from PCP. The following changes have been made: Okay to stop aspirin  Patient Instructions  Medication  Stop aspirin     You have been referred to [facility]   Your physician wants you to follow-up in [place] Maryssa Giampietro.You will receive a reminder letter in the mail two months in advance. If you don't receive a letter, please call our office to schedule the follow-up appointment.   If you need a refill on your cardiac medications before your next appointment, please call your pharmacy.    Studies Ordered:   No orders of the defined types were placed in this encounter.     Glenetta Hew, M.D., M.S. Interventional Cardiologist   Pager # [phone] Phone # [phone] [address] [place], [place] [address]

## 2017-05-19 ENCOUNTER — Encounter: Payer: Self-pay | Admitting: Cardiology

## 2017-05-19 DIAGNOSIS — Z9861 Coronary angioplasty status: Secondary | ICD-10-CM

## 2017-05-19 DIAGNOSIS — I2510 Atherosclerotic heart disease of native coronary artery without angina pectoris: Secondary | ICD-10-CM | POA: Insufficient documentation

## 2017-05-19 NOTE — Assessment & Plan Note (Signed)
I congratulated her on having quit smoking at the time of her MI and not going back to it. This removes 1 major risk factor.

## 2017-05-19 NOTE — Assessment & Plan Note (Addendum)
4 site PCI with 4 stents in 2 vessels. DES stents. No further anginal symptoms. She is now beyond 1 year from her PCI and we can stop aspirin will continue Brilinta. If she is having some bruising issues we can reduce Brilinta dose to 60 mg maintenance dose versus convert to Plavix if financially more feasible. Maintain her on [place] because of her extensive stent work. Also with her being a diabetic, she is more likely to have in-stent restenosis issues.  Plan: DC aspirin and continue Brilinta. Not currently on beta blocker or ACE inhibitor with normal blood pressure. We'll reassess in follow-up and determine if he could potentially start ACE inhibitor with her being a diabetic. She is on high-dose statin plus fenofibrate. As far as I can tell this is been for some time. Her most recent labs showed notable improvement of her triglycerides that are now essentially at goal, but her LDL is still not at goal. --> We need to get labs were PCP to determine if is been any improvement.  I referred her to our Cardiovascular Risk Reduction Clinic ([facility]) run by our clinical pharmacists. [facility]. They can discuss potentially converting from fenofibrate to Zetia versus simply adding Zetia versus considering PCSK9 inhibitor. Goal LDL should be less than 70 with her extensive disease and risk factors of diabetes

## 2017-05-19 NOTE — Assessment & Plan Note (Signed)
Notably improved triglycerides while on fenofibrate. Now essentially at goal. LDL still not at goal however. Referring to CVRR clinic

## 2017-05-19 NOTE — Assessment & Plan Note (Signed)
She had non-STEMI with probably the culprit lesion being the RCA mid lesion. For total stents placed. No further anginal symptoms. No heart failure symptoms. Preserved LVEF with normal diastolic function. She is as active as she is able to be. Now here for stable follow-up

## 2017-06-05 ENCOUNTER — Ambulatory Visit: Payer: BLUE CROSS/BLUE SHIELD

## 2017-06-05 NOTE — Progress Notes (Deleted)
Patient ID: Abigail [name]                 DOB: [birth year]                    MRN: [id number]     HPI: Abigail [name] is a 56 y.o. female patient of Dr. Ellyn Hack that presents today for lipid evaluation. PMH includes CAD with  significant disease in the RCA and LAD. - She had 2 lesions each in the RCA and LAD treated with a total of 4 stents.   Risk Factors: CAD, DM LDL Goal: <70, TG <150, nonHDL <100  Current Medications: fenofibrate 169m daily, atorvastatin 872mdaily  Intolerances: none  Diet:   Exercise:   Family History: CAD in her mother; Heart attack in her maternal grandfather and paternal grandfather; Heart disease in her father; Prostate cancer in her father.  Social History: former tobacco user.   Labs: 02/02/17: TC 164, TG 151, HDL 43, LDL 91, nonHDL 121  Past Medical History:  Diagnosis Date  . [place]  . CAD S/P percutaneous coronary angioplasty 04/06/2016   2 Vessel CAD: Ost-prox-mid RCA (80%, 80% & 99%) -> Synergy DES 2.5 x 38, mid-dist RCA -> Synergy DES 2.25 x 20.  mid LAD 80% -> Synergy DES 3.0 x 12 (3.3 mm), dist LAD 85%, D1 occluded-> Synergy DES 3.0 x 12 (3.3 mm)   . [place]   uterus  . Diabetes mellitus without complication ([place]  . Hypertriglyceridemia   . Non-STEMI (non-ST elevated myocardial infarction) ([place]   Found to have severe 2 vessel disease involving the ostial to mid RCA and then mid to distal RCA as well as mid and distal LAD. - 4 site PCI    Current Outpatient Prescriptions on File Prior to Visit  Medication Sig Dispense Refill  . atorvastatin (LIPITOR) 80 MG tablet Take 1 tablet (80 mg total) by mouth daily at 6 PM. 30 tablet 1  . blood glucose meter kit and supplies KIT Dispense based on patient and insurance preference. Use up to four times daily as directed. (FOR ICD-9 250.00, 250.01). 1 each 0  . BRILINTA 90 MG TABS tablet TAKE ONE TABLET BY MOUTH TWICE DAILY 60 tablet 10  . fenofibrate 160 MG  tablet Take 1 tablet (160 mg total) by mouth daily. 30 tablet 1  . insulin glargine (LANTUS) 100 UNIT/ML injection Inject 0.1 mLs (10 Units total) into the skin at bedtime. (Patient taking differently: Inject 14-17 Units into the skin at bedtime. ) 10 mL 11  . levothyroxine (SYNTHROID, LEVOTHROID) 75 MCG tablet Take 75 mcg by mouth daily before breakfast.    . metFORMIN (GLUCOPHAGE) 500 MG tablet Take 1,000 mg by mouth 2 (two) times daily with a meal.     No current facility-administered medications on file prior to visit.     No Known Allergies  Assessment/Plan: Hyperlipidemia:    Thank you,  KeLelan [facility]  06/05/2017 7:42 AM

## 2018-01-09 ENCOUNTER — Other Ambulatory Visit: Payer: Self-pay | Admitting: Family Medicine

## 2018-01-09 DIAGNOSIS — R0989 Other specified symptoms and signs involving the circulatory and respiratory systems: Secondary | ICD-10-CM

## 2018-01-14 ENCOUNTER — Ambulatory Visit: Payer: BLUE CROSS/BLUE SHIELD | Admitting: Cardiology

## 2018-01-14 ENCOUNTER — Encounter: Payer: Self-pay | Admitting: Cardiology

## 2018-01-14 VITALS — BP 128/62 | HR 74 | Ht 62.0 in | Wt 133.6 lb

## 2018-01-14 DIAGNOSIS — I2510 Atherosclerotic heart disease of native coronary artery without angina pectoris: Secondary | ICD-10-CM | POA: Diagnosis not present

## 2018-01-14 DIAGNOSIS — E785 Hyperlipidemia, unspecified: Secondary | ICD-10-CM | POA: Diagnosis not present

## 2018-01-14 DIAGNOSIS — Z9861 Coronary angioplasty status: Secondary | ICD-10-CM | POA: Diagnosis not present

## 2018-01-14 DIAGNOSIS — I214 Non-ST elevation (NSTEMI) myocardial infarction: Secondary | ICD-10-CM

## 2018-01-14 MED ORDER — TICAGRELOR 60 MG PO TABS: 60.0000 mg | ORAL_TABLET | Freq: Two times a day (BID) | ORAL | 3 refills | Status: DC

## 2018-01-14 NOTE — Progress Notes (Signed)
PCP: Lois Huxley, PA  Clinic Note: Chief Complaint  Patient presents with  . Follow-up    No complaints  . Coronary Artery Disease    HPI: Abigail [name] is a 57 y.o. female with a PMH below who presents today for 3 month Follow-up for CAD. I first and last saw her in April 2017 when she presented with a non-ST elevation MI. I saw her in consultation. She was referred for cardiac catheterization that revealed significant disease in the RCA and LAD. - She had 2 lesions each in the RCA and LAD treated with a total of 4 stents.  Abigail [name] was last seen on 05/18/2017 -- was fairly stable from a cardiac standpoint. Plan was to consider reducing to 60 mg Brilinta at next visit versus converting to Plavix. - She  successfully quit smoking at the time of her MI and has not gone back to cigarettes.  Recent Hospitalizations: None  Studies Personally Reviewed - (if available, images/films reviewed: From Epic Chart or Care Everywhere)  She has carotid Dopplers scheduled for later this month because a right carotid bruit.  Interval History: Abigail [name] presents today overall stable from a cardiac standpoint. She says her labs were again checked by PCP on January 22 --I still do not have results.   She returns today doing quite well with no major complaints. She does continue to walk with a cane because of her knee and hip pain, but is not limited otherwise. No claudication symptoms. No resting or exertional chest tightness/pressure or dyspnea. No significant bleeding or bruising. No melena, hematochezia hematuria. She denies any rapid irregular heartbeats palpitations. No heart failure symptoms of PND, orthopnea or edema.  Remainder of cardiac review of symptoms: No dizziness, weakness or syncope/near syncope, or TIA/amaurosis fugax symptoms. No claudication.  ROS: A comprehensive was performed. Review of Systems  Constitutional: Negative for malaise/fatigue.  HENT: Negative for  nosebleeds.   Respiratory: Negative for cough, shortness of breath and wheezing.   Gastrointestinal: Negative for blood in stool and melena.  Genitourinary: Negative for hematuria.  Musculoskeletal: Positive for joint pain (Right hip and back). Negative for falls and myalgias (Right leg; pre-dates starting statin therapy).  Neurological: Positive for focal weakness (Chronic/persistent weakness of the right hip and leg. Possibly radicular. Walks with a cane.).  Endo/Heme/Allergies: Does not bruise/bleed easily.  Psychiatric/Behavioral: Negative for depression and memory loss. The patient is not nervous/anxious.   All other systems reviewed and are negative.  I have reviewed and (if needed) personally updated the patient's problem list, medications, allergies, past medical and surgical history, social and family history.   Past Medical History:  Diagnosis Date  . Bipolar disorder ([place])   . CAD S/P percutaneous coronary angioplasty 04/06/2016   2 Vessel CAD: Ost-prox-mid RCA (80%, 80% & 99%) -> Synergy DES 2.5 x 38, mid-dist RCA -> Synergy DES 2.25 x 20.  mid LAD 80% -> Synergy DES 3.0 x 12 (3.3 mm), dist LAD 85%, D1 occluded-> Synergy DES 3.0 x 12 (3.3 mm)   . Cancer ([place])    uterus  . Diabetes mellitus without complication ([place])   . Hypertriglyceridemia   . Non-STEMI (non-ST elevated myocardial infarction) ([place]) 03/2016   Found to have severe 2 vessel disease involving the ostial to mid RCA and then mid to distal RCA as well as mid and distal LAD. - 4 site PCI    Past Surgical History:  Procedure Laterality Date  . ABDOMINAL HYSTERECTOMY    . CARDIAC  CATHETERIZATION N/A 04/14/2016   Procedure: Left Heart Cath and Coronary Angiography;  Surgeon: Troy Sine, MD;  Location: [facility];  Service: Cardiovascular: NSTEMI -> ost RCA 80%, prox RCA 80%, mid RCA 99%, mid-dist RCA 85% (pre bifurcation). Mid LAD-Diag1 85% (with D1 85% - occluded post PCI of LAD, small caliber), dist LAD  85%.  [name] CARDIAC CATHETERIZATION N/A 04/14/2016   Procedure: Coronary Stent Intervention;  Surgeon: Troy Sine, MD;  Location: [facility]: 2 Vessel, 2 non-overlapping stents each: ost-mid RCA (3 lesions) - Synergy DES 2.5 mm x 38 mm, mid-dist RCA - Syergy DES 2.25 mm x 20 mm. mid LAD 3.0 mm x 12 mm (3.3 mm), dist LAD Synergy DES 3.0 mm x 24 mm (3.3 mm) -loss of flow and small D1  . TRANSTHORACIC ECHOCARDIOGRAM  03/2016   Post NSTEMI:  Normal LV size and function. EF 55-60%. Normal diastolic parameters. Mild LA dilation. Trivial AI    Cardiac Catheterization-PCI 04/06/2016 (Dr. Claiborne Billings): 2 Vessel / 4 Site PCI Diagnostic Diagram       Post-Intervention Diagram           Current Meds  Medication Sig  . atorvastatin (LIPITOR) 80 MG tablet Take 1 tablet (80 mg total) by mouth daily at 6 PM.  . blood glucose meter kit and supplies KIT Dispense based on patient and insurance preference. Use up to four times daily as directed. (FOR ICD-9 250.00, 250.01).  [name] empagliflozin (JARDIANCE) 25 MG TABS tablet Take 25 mg by mouth daily.  . fenofibrate 160 MG tablet Take 1 tablet (160 mg total) by mouth daily.  . insulin glargine (LANTUS) 100 UNIT/ML injection Inject 0.1 mLs (10 Units total) into the skin at bedtime. (Patient taking differently: Inject 14-17 Units into the skin at bedtime. )  . levothyroxine (SYNTHROID, LEVOTHROID) 75 MCG tablet Take 75 mcg by mouth daily before breakfast.  . metFORMIN (GLUCOPHAGE) 500 MG tablet Take 1,000 mg by mouth 2 (two) times daily with a meal.  . [DISCONTINUED] BRILINTA 90 MG TABS tablet TAKE ONE TABLET BY MOUTH TWICE DAILY    No Known Allergies  Social History   Socioeconomic History  . Marital status: Divorced    Spouse name: None  . Number of children: None  . Years of education: None  . Highest education level: None  Social Needs  . Financial resource strain: None  . Food insecurity - worry: None  . Food insecurity - inability: None  .  Transportation needs - medical: None  . Transportation needs - non-medical: None  Occupational History  . None  Tobacco Use  . Smoking status: Former Smoker    Last attempt to quit: 04/25/2016    Years since quitting: 1.7  . Smokeless tobacco: Never Used  . Tobacco comment: quit smoking in Apr 2017  Substance and Sexual Activity  . Alcohol use: No    Alcohol/week: 0.0 oz  . Drug use: No  . Sexual activity: None  Other Topics Concern  . None  Social History Narrative  . None    family history includes CAD in her mother; Heart attack in her maternal grandfather and paternal grandfather; Heart disease in her father; Prostate cancer in her father.  Wt Readings from Last 3 Encounters:  01/14/18 133 lb 9.6 oz (60.6 kg)  05/18/17 132 lb 9.6 oz (60.1 kg)  02/02/17 131 lb (59.4 kg)    PHYSICAL EXAM BP 128/62   Pulse 74   Ht 5' 2" (  1.575 m)   Wt 133 lb 9.6 oz (60.6 kg)   BMI 24.44 kg/m   Physical Exam  Constitutional: She is oriented to person, place, and time. She appears well-developed and well-nourished. No distress.  Pleasant. Healthy-appearing. Well-groomed  HENT:  Head: Normocephalic and atraumatic.  Eyes: EOM are normal. Pupils are equal, round, and reactive to light.  Neck: No hepatojugular reflux and no JVD present. Carotid bruit is present (Soft right).  Cardiovascular: Normal rate, regular rhythm, normal heart sounds and normal pulses.  No extrasystoles are present. PMI is not displaced. Exam reveals no gallop and no friction rub.  No murmur heard. Pulmonary/Chest: Effort normal and breath sounds normal. No respiratory distress. She has no wheezes. She has no rales.  Abdominal: Soft. Bowel sounds are normal. She exhibits no distension. There is no tenderness. There is no rebound.  Musculoskeletal: Normal range of motion. She exhibits no edema.  Mild antalgic gait favoring the right leg.  Neurological: She is alert and oriented to person, place, and time.  Skin: Skin  is warm and dry.  Psychiatric: She has a normal mood and affect. Her behavior is normal. Judgment and thought content normal.  Nursing note and vitals reviewed.   Adult ECG Report Normal sinus rhythm, rate 74 BPM. Normal axis, intervals and durations. Normal/stable EKG  Other studies Reviewed: Additional studies/ records that were reviewed today include:  Recent Labs:     Lab Results  Component Value Date   CHOL 164 02/02/2017   HDL 43 (L) 02/02/2017   LDLCALC 91 02/02/2017   TRIG 151 (H) 02/02/2017   CHOLHDL 3.8 02/02/2017  - just checked by PCP January 22   ASSESSMENT / PLAN: Problem List Items Addressed This Visit    CAD S/P percutaneous coronary angioplasty - Primary (Chronic)    For slight PCI with 4 stents in 2 vessels back in 2017.  No further anginal symptoms.  She seems to be doing relatively well on Brilinta alone without the aspirin.  No further bleeding. She is on a combination of fenofibrate and atorvastatin.  Also for her diabetes she is on Jardiance.  Plan: Reduce Brilinta dose to 60 mg twice daily. --> Continue at present without any blood pressure medications.  Low threshold for adding an ACE Inhibitor/ARB given her diabetes and coronary disease..      Relevant Orders   EKG 12-Lead (Completed)   Hyperlipidemia with target low density lipoprotein (LDL) cholesterol less than 70 mg/dL (Chronic)    On high-dose statin plus fenofibrate.  Just had her labs checked which were not yet available.  Based on last of the labs her LDL was 91 and triglycerides 153.  If we are not able to achieve goal with these 2 medications, may need to consider either adding Zetia versus converting to PCSK9 inhibitor.  We will try to get lab results from her PCP.      NSTEMI (non-ST elevated myocardial infarction) ([place]) (Chronic)    Almost 2 years out from her non-STEMI BRCA probably be the culprit lesion but also having stent to the LAD as well.  No further anginal symptoms.  Preserved  LVEF with no heart failure symptoms. Remains active with no recurrent symptoms on no blood pressure medications but on statin and Brilinta.         Current medicines are reviewed at length with the patient today. (+/- concerns) Need to have labs from PCP. The following changes have been made: Okay to stop aspirin  Patient Instructions  MEDICATION CHANGES  STOP TAKING BRILINTA 106 MG   START TAKING BRILINTA 60 MG TWICE A DAY   Your physician wants you to follow-up in [place]. You will receive a reminder letter in the mail two months in advance. If you don't receive a letter, please call our office to schedule the follow-up appointment.    If you need a refill on your cardiac medications before your next appointment, please call your pharmacy.    Studies Ordered:   Orders Placed This Encounter  Procedures  . EKG 12-Lead      Glenetta Hew, M.D., M.S. Interventional Cardiologist   Pager # [phone] Phone # [phone] [address] [place], [place] [address]

## 2018-01-14 NOTE — Patient Instructions (Addendum)
MEDICATION CHANGES  STOP TAKING BRILINTA 58 MG   START TAKING BRILINTA 60 MG TWICE A DAY   Your physician wants you to follow-up in [place]. You will receive a reminder letter in the mail two months in advance. If you don't receive a letter, please call our office to schedule the follow-up appointment.    If you need a refill on your cardiac medications before your next appointment, please call your pharmacy.

## 2018-01-15 ENCOUNTER — Encounter: Payer: Self-pay | Admitting: Cardiology

## 2018-01-15 NOTE — Assessment & Plan Note (Signed)
On high-dose statin plus fenofibrate.  Just had her labs checked which were not yet available.  Based on last of the labs her LDL was 91 and triglycerides 153.  If we are not able to achieve goal with these 2 medications, may need to consider either adding Zetia versus converting to PCSK9 inhibitor.  We will try to get lab results from her PCP.

## 2018-01-15 NOTE — Assessment & Plan Note (Signed)
For slight PCI with 4 stents in 2 vessels back in 2017.  No further anginal symptoms.  She seems to be doing relatively well on Brilinta alone without the aspirin.  No further bleeding. She is on a combination of fenofibrate and atorvastatin.  Also for her diabetes she is on Jardiance.  Plan: Reduce Brilinta dose to 60 mg twice daily. --> Continue at present without any blood pressure medications.  Low threshold for adding an ACE Inhibitor/ARB given her diabetes and coronary disease.[name]

## 2018-01-15 NOTE — Assessment & Plan Note (Signed)
On combination of insulin metformin and now Jardiance.  Jardiance definitely has a increased cardia vascular benefit.

## 2018-01-15 NOTE — Assessment & Plan Note (Signed)
Almost 2 years out from her non-STEMI BRCA probably be the culprit lesion but also having stent to the LAD as well.  No further anginal symptoms.  Preserved LVEF with no heart failure symptoms. Remains active with no recurrent symptoms on no blood pressure medications but on statin and Brilinta.

## 2018-01-16 ENCOUNTER — Ambulatory Visit
Admission: RE | Admit: 2018-01-16 | Discharge: 2018-01-16 | Disposition: A | Discharge status: Home / Self Care | Payer: BLUE CROSS/BLUE SHIELD | Source: Ambulatory Visit | Attending: Family Medicine | Admitting: Family Medicine

## 2018-01-16 DIAGNOSIS — R0989 Other specified symptoms and signs involving the circulatory and respiratory systems: Secondary | ICD-10-CM

## 2018-01-25 ENCOUNTER — Other Ambulatory Visit: Payer: Self-pay

## 2018-01-25 DIAGNOSIS — I6523 Occlusion and stenosis of bilateral carotid arteries: Secondary | ICD-10-CM

## 2018-01-27 ENCOUNTER — Other Ambulatory Visit: Payer: Self-pay | Admitting: Family Medicine

## 2018-01-27 DIAGNOSIS — I771 Stricture of artery: Secondary | ICD-10-CM

## 2018-01-27 DIAGNOSIS — I6522 Occlusion and stenosis of left carotid artery: Secondary | ICD-10-CM

## 2018-02-13 ENCOUNTER — Other Ambulatory Visit: Payer: BLUE CROSS/BLUE SHIELD

## 2018-02-23 ENCOUNTER — Ambulatory Visit
Admission: RE | Admit: 2018-02-23 | Discharge: 2018-02-23 | Disposition: A | Discharge status: Home / Self Care | Payer: BLUE CROSS/BLUE SHIELD | Source: Ambulatory Visit | Attending: Family Medicine | Admitting: Family Medicine

## 2018-02-23 DIAGNOSIS — I771 Stricture of artery: Secondary | ICD-10-CM

## 2018-02-23 DIAGNOSIS — I6522 Occlusion and stenosis of left carotid artery: Secondary | ICD-10-CM

## 2018-02-23 MED ORDER — IOPAMIDOL (ISOVUE-370) INJECTION 76%
75.0000 mL | Freq: Once | INTRAVENOUS | Status: AC | PRN
  Administered 2018-02-23: 75 mL via INTRAVENOUS

## 2018-03-19 ENCOUNTER — Ambulatory Visit (INDEPENDENT_AMBULATORY_CARE_PROVIDER_SITE_OTHER): Payer: BLUE CROSS/BLUE SHIELD | Admitting: Vascular Surgery

## 2018-03-19 ENCOUNTER — Other Ambulatory Visit: Payer: Self-pay

## 2018-03-19 ENCOUNTER — Encounter: Payer: Self-pay | Admitting: Vascular Surgery

## 2018-03-19 ENCOUNTER — Inpatient Hospital Stay (HOSPITAL_COMMUNITY): Admission: RE | Admit: 2018-03-19 | Payer: BLUE CROSS/BLUE SHIELD | Source: Ambulatory Visit

## 2018-03-19 VITALS — BP 177/84 | HR 82 | Temp 98.9°F | Resp 16 | Ht 62.0 in | Wt 132.0 lb

## 2018-03-19 DIAGNOSIS — I6523 Occlusion and stenosis of bilateral carotid arteries: Secondary | ICD-10-CM

## 2018-03-19 NOTE — Progress Notes (Signed)
[facility]  Patient name: Abigail [name] MRN: [id number] DOB: 04/23/1961 Sex: female  REASON FOR CONSULT: Evaluation of asymptomatic carotid disease  HPI: Abigail [name] is a 57 y.o. female, who is seen today for evaluation of asymptomatic carotid disease.  Found to have a right carotid bruit and subsequently underwent carotid duplex and then carotid CT for further evaluation.  She is seen today for discussion of these studies.  She has no history of prior neurologic deficit.  Specifically no amaurosis fugax, transient ischemic attack or stroke.  She does have a history of coronary artery disease and is undergone prior coronary stenting in 2017.  She has no symptoms of peripheral occlusive disease.  Past Medical History:  Diagnosis Date  . Bipolar disorder ([place])   . CAD S/P percutaneous coronary angioplasty 04/06/2016   2 Vessel CAD: Ost-prox-mid RCA (80%, 80% & 99%) -> Synergy DES 2.5 x 38, mid-dist RCA -> Synergy DES 2.25 x 20.  mid LAD 80% -> Synergy DES 3.0 x 12 (3.3 mm), dist LAD 85%, D1 occluded-> Synergy DES 3.0 x 12 (3.3 mm)   . Cancer ([place])    uterus  . Diabetes mellitus without complication ([place])   . Hypertriglyceridemia   . Non-STEMI (non-ST elevated myocardial infarction) ([place]) 03/2016   Found to have severe 2 vessel disease involving the ostial to mid RCA and then mid to distal RCA as well as mid and distal LAD. - 4 site PCI    Family History  Problem Relation Age of Onset  . CAD Mother        s/p CABG  . Heart disease Father   . Prostate cancer Father   . Heart attack Maternal Grandfather   . Heart attack Paternal Grandfather     SOCIAL HISTORY: Social History   Socioeconomic History  . Marital status: Divorced    Spouse name: Not on file  . Number of children: Not on file  . Years of education: Not on file  . Highest education level: Not on file  Occupational History  . Not on file    Social Needs  . Financial resource strain: Not on file  . Food insecurity:    Worry: Not on file    Inability: Not on file  . Transportation needs:    Medical: Not on file    Non-medical: Not on file  Tobacco Use  . Smoking status: Former Smoker    Last attempt to quit: 04/25/2016    Years since quitting: 1.8  . Smokeless tobacco: Never Used  . Tobacco comment: quit smoking in Apr 2017  Substance and Sexual Activity  . Alcohol use: No    Alcohol/week: 0.0 oz  . Drug use: No  . Sexual activity: Not on file  Lifestyle  . Physical activity:    Days per week: Not on file    Minutes per session: Not on file  . Stress: Not on file  Relationships  . Social connections:    Talks on phone: Not on file    Gets together: Not on file    Attends religious service: Not on file    Active member of club or organization: Not on file    Attends meetings of clubs or organizations: Not on file    Relationship status: Not on file  . Intimate partner violence:    Fear of current or ex partner: Not on file    Emotionally abused: Not on file    Physically  abused: Not on file    Forced sexual activity: Not on file  Other Topics Concern  . Not on file  Social History Narrative  . Not on file    No Known Allergies  Current Outpatient Medications  Medication Sig Dispense Refill  . atorvastatin (LIPITOR) 80 MG tablet Take 1 tablet (80 mg total) by mouth daily at 6 PM. 30 tablet 1  . blood glucose meter kit and supplies KIT Dispense based on patient and insurance preference. Use up to four times daily as directed. (FOR ICD-9 250.00, 250.01). 1 each 0  . docusate sodium (STOOL SOFTENER) 100 MG capsule Take 100 mg by mouth 2 (two) times daily.    . empagliflozin (JARDIANCE) 25 MG TABS tablet Take 25 mg by mouth daily.    . fenofibrate 160 MG tablet Take 1 tablet (160 mg total) by mouth daily. 30 tablet 1  . ferrous sulfate 324 (65 Fe) MG TBEC Take by mouth.    . insulin glargine (LANTUS) 100  UNIT/ML injection Inject 0.1 mLs (10 Units total) into the skin at bedtime. (Patient taking differently: Inject 14-17 Units into the skin at bedtime. ) 10 mL 11  . levothyroxine (SYNTHROID, LEVOTHROID) 75 MCG tablet Take 75 mcg by mouth daily before breakfast.    . losartan (COZAAR) 25 MG tablet Take 25 mg by mouth daily.    . metFORMIN (GLUCOPHAGE) 500 MG tablet Take 1,000 mg by mouth 2 (two) times daily with a meal.    . ticagrelor (BRILINTA) 60 MG TABS tablet Take 1 tablet (60 mg total) by mouth 2 (two) times daily. 180 tablet 3   No current facility-administered medications for this visit.     REVIEW OF SYSTEMS:  [X]  denotes positive finding, [ ]  denotes negative finding Cardiac  Comments:  Chest pain or chest pressure:    Shortness of breath upon exertion:    Short of breath when lying flat:    Irregular heart rhythm:        Vascular    Pain in calf, thigh, or hip brought on by ambulation:    Pain in feet at night that wakes you up from your sleep:     Blood clot in your veins:    Leg swelling:         Pulmonary    Oxygen at home:    Productive cough:     Wheezing:         Neurologic    Sudden weakness in arms or legs:     Sudden numbness in arms or legs:     Sudden onset of difficulty speaking or slurred speech:    Temporary loss of vision in one eye:     Problems with dizziness:         Gastrointestinal    Blood in stool:     Vomited blood:         Genitourinary    Burning when urinating:     Blood in urine:        Psychiatric    Major depression:         Hematologic    Bleeding problems:    Problems with blood clotting too easily:        Skin    Rashes or ulcers:        Constitutional    Fever or chills:      PHYSICAL EXAM: Vitals:   03/19/18 0925 03/19/18 0929  BP: (!) 191/88 (!) 177/84  Pulse: 82  Resp: 16   Temp: 98.9 F (37.2 C)   TempSrc: Oral   SpO2: 98%   Weight: 132 lb (59.9 kg)   Height: 5' 2"  (1.575 m)     GENERAL: The patient  is a well-nourished female, in no acute distress. The vital signs are documented above. CARDIOVASCULAR: She does have a right carotid bruit and no bruit on the left.  She has 2+ left radial pulse and 1+ right radial pulse.  She has 2+ femoral, 2+ popliteal 2+ dorsalis pedis and 2+ posterior tibial pulses bilaterally PULMONARY: There is good air exchange  ABDOMEN: Soft and non-tender  MUSCULOSKELETAL: There are no major deformities or cyanosis. NEUROLOGIC: No focal weakness or paresthesias are detected. SKIN: There are no ulcers or rashes noted. PSYCHIATRIC: The patient has a normal affect.  DATA:  Carotid duplex suggested 70% left carotid stenosis and right subclavian occlusive disease  CT angiogram showed 70% carotid stenosis which is smooth on the left and no significant right carotid stenosis.  She does have a moderate right subclavian stenosis and a high-grade narrowing of her nondominant right vertebral artery origin  MEDICAL ISSUES: Long discussion with patient regarding this.  She has had no symptoms referable to her carotid disease.  I have recommended that she have a 93-monthserial duplex for further evaluation.  I did explain symptoms of carotid disease and asked her to present immediately to the emergency room should she have any neurologic deficits.  Also explained that due to her right subclavian stenosis, she should always have her blood pressure checked in the left arm only for accuracy.  She understands and will see u[place] with carotid duplex in 6 months   TRosetta Posner MD FMinimally Invasive Surgery HawaiiVascular and [facility] of GSeven [facility] Behavioral InstituteTel [phone] [phone]

## 2018-05-14 ENCOUNTER — Other Ambulatory Visit: Payer: Self-pay | Admitting: Cardiovascular Disease

## 2018-05-14 NOTE — Telephone Encounter (Signed)
Rx sent to pharmacy   

## 2018-05-28 IMAGING — CT CT ANGIO NECK
2 of 4 series · 6 of 32 positions shown, 11 images · IV contrast (APPLIED)
Comparison: None.

CLINICAL DATA: Bilateral carotid stenosis.

EXAM:
CT ANGIOGRAPHY NECK
TECHNIQUE: Multidetector CT imaging of the neck was performed using the
standard protocol during bolus administration of intravenous
contrast. Multiplanar CT image reconstructions and MIPs were
obtained to evaluate the vascular anatomy. Carotid stenosis
measurements (when applicable) are obtained utilizing NASCET
criteria, using the distal internal carotid diameter as the
denominator.
CONTRAST:  75mL OWRSIA-II8 IOPAMIDOL (OWRSIA-II8) INJECTION 76%

[Series 6: carotid angio · axial · 0.43mm/px · z∈[-286,-127]mm · 4 of 89 slices shown, 9 images]
[im 18/89  soft-tissue]
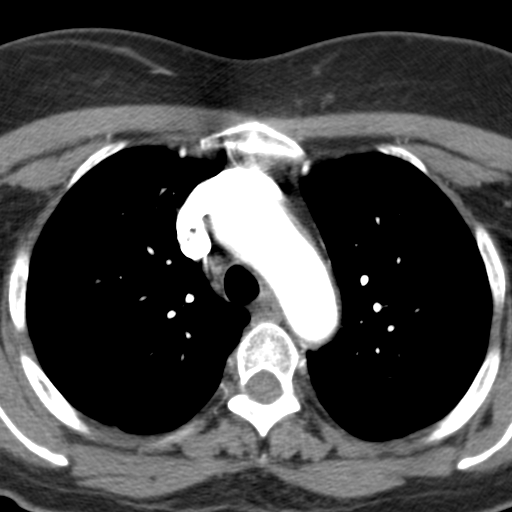
[im 18/89  lung]
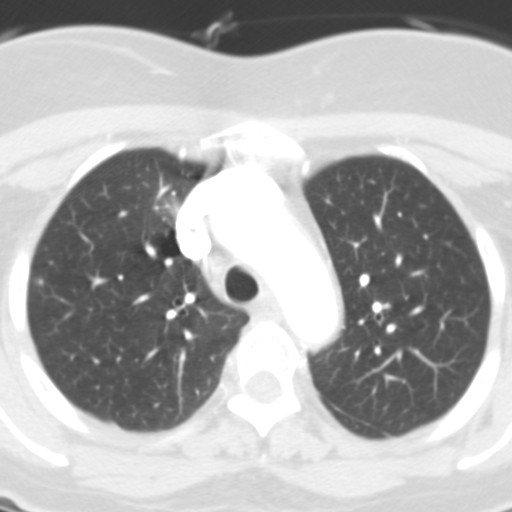
[im 18/89  bone]
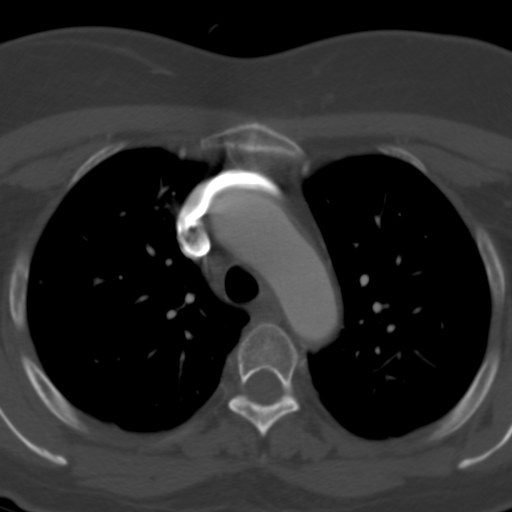
[im 36/89  soft-tissue]
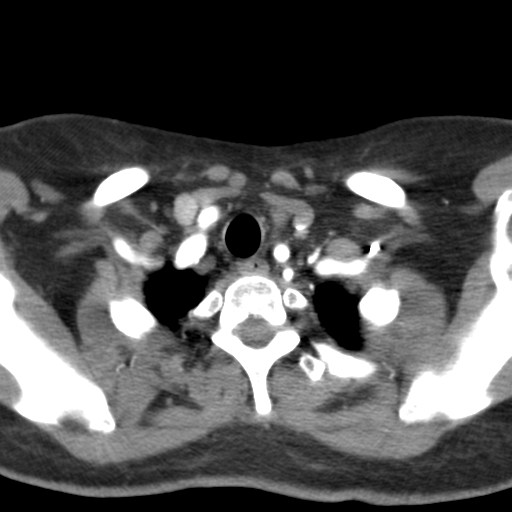
[im 36/89  lung]
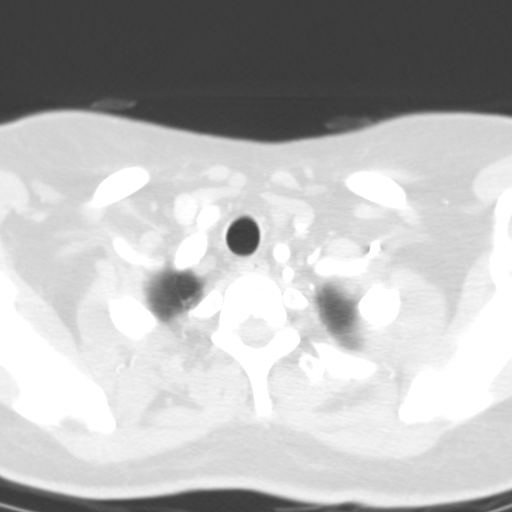
[im 53/89  soft-tissue]
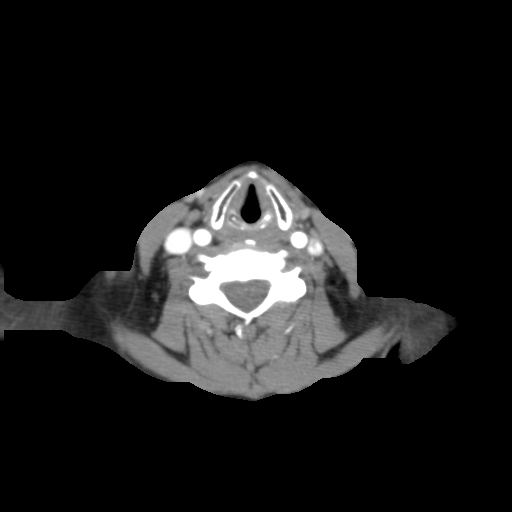
[im 53/89  lung]
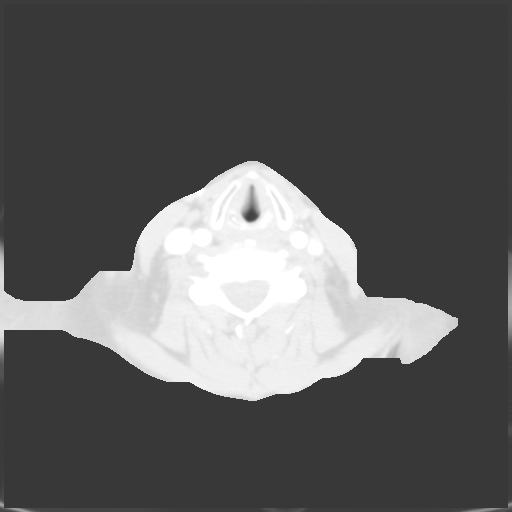
[im 71/89  soft-tissue]
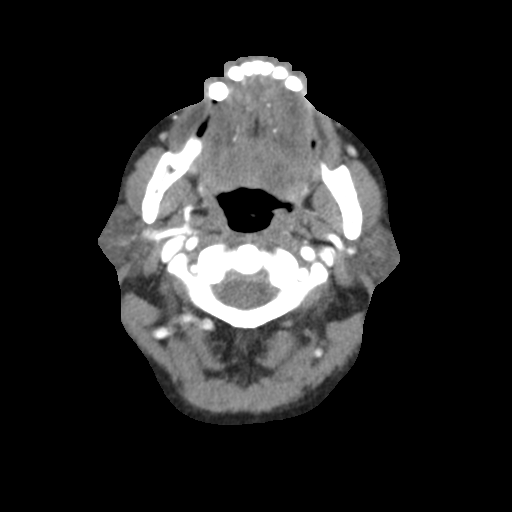
[im 71/89  lung]
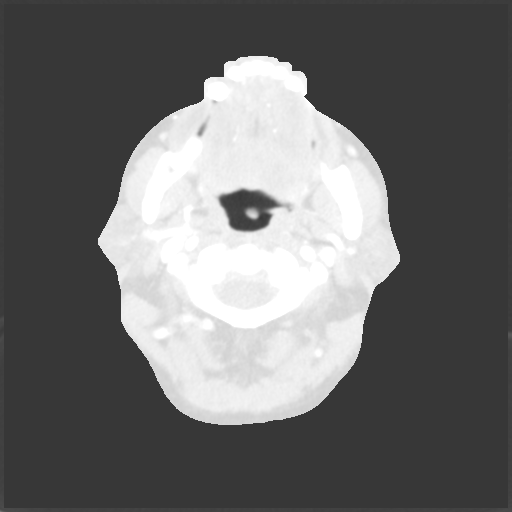

[Series 13: axial thick · axial · 0.48mm/px · z∈[-247,-162]mm · 2 of 51 slices shown]
[im 17/51  soft-tissue]
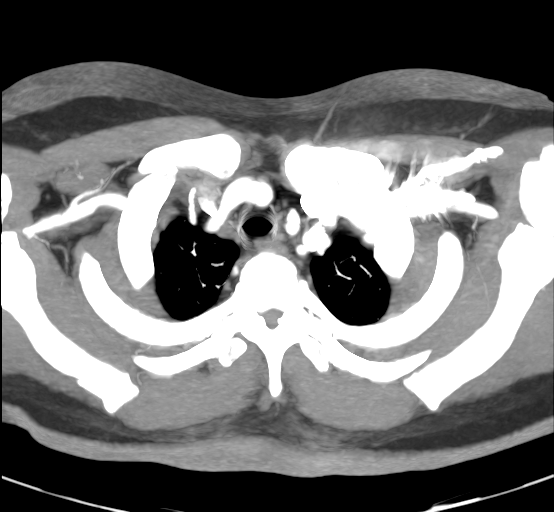
[im 34/51  soft-tissue]
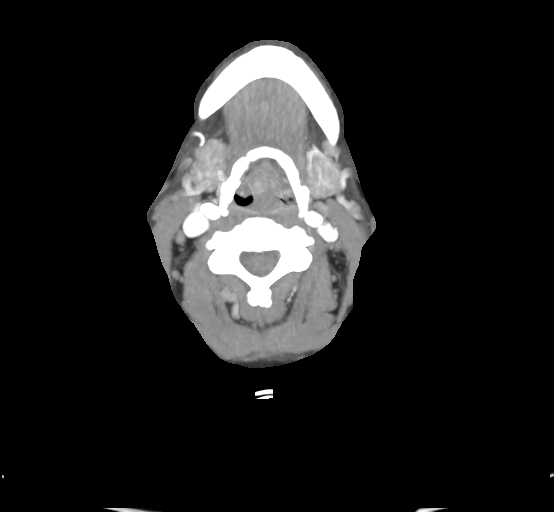

[6 of 32 positions shown; findings below may reference images not displayed]

FINDINGS: Aortic arch: Diffuse atheromatous wall thickening, mainly
noncalcified. Three vessel branching. Partially visualized LAD
atherosclerotic calcification.

Right carotid system: Noncalcified atheromatous wall thickening of
the common carotid. There is calcified plaque at the posterior wall
right ICA bulb without stenosis or ulceration. Negative for beading
or dissection.

Left carotid system: Noncalcified atheromatous wall thickening of
the proximal common carotid without no significant stenosis and no
ulceration. Primarily noncalcified plaque at the left ICA bulb with
stenosis measuring 70% on sagittal reformats. No superimposed
ulceration. Negative for downstream beading.

Vertebral arteries: Atherosclerotic plaque on the proximal
subclavians. Right subclavian stenosis measures 55% before the
vertebral origin. The left vertebral artery is dominant, smooth, and
widely patent. There is high-grade narrowing at the origin of the
right vertebral artery.

Skeleton: No acute or aggressive finding

Other neck: No incidental mass or inflammation.

Upper chest: Negative
IMPRESSION: 1. Cervical carotid atherosclerosis with 
70% left ICA bulb
stenosis.
2. No significant carotid stenosis on the right.
3. 55% proximal right subclavian stenosis. High-grade narrowing at
the origin of the non dominant right vertebral artery.
4.  Aortic Atherosclerosis (18493-T3T.T).  Coronary atherosclerosis.

## 2018-08-14 ENCOUNTER — Other Ambulatory Visit: Payer: Self-pay

## 2018-08-14 DIAGNOSIS — I6523 Occlusion and stenosis of bilateral carotid arteries: Secondary | ICD-10-CM

## 2018-09-20 ENCOUNTER — Encounter (HOSPITAL_COMMUNITY): Payer: BLUE CROSS/BLUE SHIELD

## 2018-09-20 ENCOUNTER — Ambulatory Visit: Payer: Self-pay | Admitting: Family

## 2018-11-04 ENCOUNTER — Encounter (HOSPITAL_COMMUNITY): Payer: Self-pay

## 2018-11-04 ENCOUNTER — Ambulatory Visit: Payer: Self-pay | Admitting: Family

## 2019-01-20 ENCOUNTER — Ambulatory Visit: Payer: Self-pay | Admitting: Cardiology

## 2019-02-05 ENCOUNTER — Other Ambulatory Visit: Payer: Self-pay | Admitting: Cardiology

## 2019-05-15 ENCOUNTER — Other Ambulatory Visit: Payer: Self-pay | Admitting: Cardiology

## 2020-03-11 ENCOUNTER — Other Ambulatory Visit: Payer: Self-pay

## 2020-11-27 ENCOUNTER — Inpatient Hospital Stay (HOSPITAL_COMMUNITY): Payer: BLUE CROSS/BLUE SHIELD

## 2020-11-27 ENCOUNTER — Emergency Department (HOSPITAL_COMMUNITY): Payer: BLUE CROSS/BLUE SHIELD

## 2020-11-27 ENCOUNTER — Inpatient Hospital Stay (HOSPITAL_COMMUNITY)
Admission: EM | Admit: 2020-11-27 | Discharge: 2020-12-18 | DRG: 870 | Disposition: E | Payer: BLUE CROSS/BLUE SHIELD | Attending: Internal Medicine | Admitting: Internal Medicine

## 2020-11-27 ENCOUNTER — Other Ambulatory Visit: Payer: Self-pay

## 2020-11-27 ENCOUNTER — Encounter (HOSPITAL_COMMUNITY): Payer: Self-pay | Admitting: Critical Care Medicine

## 2020-11-27 DIAGNOSIS — I2510 Atherosclerotic heart disease of native coronary artery without angina pectoris: Secondary | ICD-10-CM | POA: Diagnosis present

## 2020-11-27 DIAGNOSIS — D6489 Other specified anemias: Secondary | ICD-10-CM | POA: Diagnosis not present

## 2020-11-27 DIAGNOSIS — U071 COVID-19: Secondary | ICD-10-CM | POA: Diagnosis present

## 2020-11-27 DIAGNOSIS — R092 Respiratory arrest: Secondary | ICD-10-CM

## 2020-11-27 DIAGNOSIS — I252 Old myocardial infarction: Secondary | ICD-10-CM

## 2020-11-27 DIAGNOSIS — N179 Acute kidney failure, unspecified: Secondary | ICD-10-CM | POA: Diagnosis not present

## 2020-11-27 DIAGNOSIS — Z87891 Personal history of nicotine dependence: Secondary | ICD-10-CM

## 2020-11-27 DIAGNOSIS — Z8042 Family history of malignant neoplasm of prostate: Secondary | ICD-10-CM | POA: Diagnosis not present

## 2020-11-27 DIAGNOSIS — G9340 Encephalopathy, unspecified: Secondary | ICD-10-CM | POA: Diagnosis not present

## 2020-11-27 DIAGNOSIS — G931 Anoxic brain damage, not elsewhere classified: Secondary | ICD-10-CM | POA: Diagnosis present

## 2020-11-27 DIAGNOSIS — I469 Cardiac arrest, cause unspecified: Secondary | ICD-10-CM | POA: Diagnosis present

## 2020-11-27 DIAGNOSIS — E781 Pure hyperglyceridemia: Secondary | ICD-10-CM | POA: Diagnosis present

## 2020-11-27 DIAGNOSIS — R6521 Severe sepsis with septic shock: Secondary | ICD-10-CM | POA: Diagnosis present

## 2020-11-27 DIAGNOSIS — G9341 Metabolic encephalopathy: Secondary | ICD-10-CM | POA: Diagnosis present

## 2020-11-27 DIAGNOSIS — N170 Acute kidney failure with tubular necrosis: Secondary | ICD-10-CM | POA: Diagnosis present

## 2020-11-27 DIAGNOSIS — Z66 Do not resuscitate: Secondary | ICD-10-CM | POA: Diagnosis not present

## 2020-11-27 DIAGNOSIS — Z7189 Other specified counseling: Secondary | ICD-10-CM | POA: Diagnosis not present

## 2020-11-27 DIAGNOSIS — I5041 Acute combined systolic (congestive) and diastolic (congestive) heart failure: Secondary | ICD-10-CM | POA: Diagnosis present

## 2020-11-27 DIAGNOSIS — J1282 Pneumonia due to coronavirus disease 2019: Secondary | ICD-10-CM | POA: Diagnosis present

## 2020-11-27 DIAGNOSIS — E1165 Type 2 diabetes mellitus with hyperglycemia: Secondary | ICD-10-CM | POA: Diagnosis present

## 2020-11-27 DIAGNOSIS — J9600 Acute respiratory failure, unspecified whether with hypoxia or hypercapnia: Secondary | ICD-10-CM

## 2020-11-27 DIAGNOSIS — A4189 Other specified sepsis: Secondary | ICD-10-CM | POA: Diagnosis present

## 2020-11-27 DIAGNOSIS — Z79890 Hormone replacement therapy (postmenopausal): Secondary | ICD-10-CM | POA: Diagnosis not present

## 2020-11-27 DIAGNOSIS — T380X5A Adverse effect of glucocorticoids and synthetic analogues, initial encounter: Secondary | ICD-10-CM | POA: Diagnosis present

## 2020-11-27 DIAGNOSIS — Z7902 Long term (current) use of antithrombotics/antiplatelets: Secondary | ICD-10-CM | POA: Diagnosis not present

## 2020-11-27 DIAGNOSIS — E785 Hyperlipidemia, unspecified: Secondary | ICD-10-CM | POA: Diagnosis present

## 2020-11-27 DIAGNOSIS — Z515 Encounter for palliative care: Secondary | ICD-10-CM

## 2020-11-27 DIAGNOSIS — Z8542 Personal history of malignant neoplasm of other parts of uterus: Secondary | ICD-10-CM

## 2020-11-27 DIAGNOSIS — Z452 Encounter for adjustment and management of vascular access device: Secondary | ICD-10-CM | POA: Diagnosis not present

## 2020-11-27 DIAGNOSIS — J9601 Acute respiratory failure with hypoxia: Secondary | ICD-10-CM

## 2020-11-27 DIAGNOSIS — R0600 Dyspnea, unspecified: Secondary | ICD-10-CM

## 2020-11-27 DIAGNOSIS — R54 Age-related physical debility: Secondary | ICD-10-CM | POA: Diagnosis present

## 2020-11-27 DIAGNOSIS — Z8249 Family history of ischemic heart disease and other diseases of the circulatory system: Secondary | ICD-10-CM

## 2020-11-27 DIAGNOSIS — Z79899 Other long term (current) drug therapy: Secondary | ICD-10-CM

## 2020-11-27 DIAGNOSIS — Z9911 Dependence on respirator [ventilator] status: Secondary | ICD-10-CM | POA: Diagnosis not present

## 2020-11-27 DIAGNOSIS — Z01818 Encounter for other preprocedural examination: Secondary | ICD-10-CM

## 2020-11-27 DIAGNOSIS — Z9289 Personal history of other medical treatment: Secondary | ICD-10-CM

## 2020-11-27 DIAGNOSIS — Z794 Long term (current) use of insulin: Secondary | ICD-10-CM

## 2020-11-27 DIAGNOSIS — F319 Bipolar disorder, unspecified: Secondary | ICD-10-CM | POA: Diagnosis present

## 2020-11-27 DIAGNOSIS — E039 Hypothyroidism, unspecified: Secondary | ICD-10-CM | POA: Diagnosis present

## 2020-11-27 DIAGNOSIS — Z0189 Encounter for other specified special examinations: Secondary | ICD-10-CM

## 2020-11-27 DIAGNOSIS — J80 Acute respiratory distress syndrome: Secondary | ICD-10-CM | POA: Diagnosis present

## 2020-11-27 DIAGNOSIS — E860 Dehydration: Secondary | ICD-10-CM | POA: Diagnosis not present

## 2020-11-27 DIAGNOSIS — R0602 Shortness of breath: Secondary | ICD-10-CM | POA: Diagnosis present

## 2020-11-27 DIAGNOSIS — Z978 Presence of other specified devices: Secondary | ICD-10-CM

## 2020-11-27 DIAGNOSIS — J9602 Acute respiratory failure with hypercapnia: Secondary | ICD-10-CM | POA: Diagnosis not present

## 2020-11-27 LAB — CBC WITH DIFFERENTIAL/PLATELET
Abs Immature Granulocytes: 0.04 10*3/uL (ref 0.00–0.07)
Basophils Absolute: 0 10*3/uL (ref 0.0–0.1)
Basophils Relative: 0 %
Eosinophils Absolute: 0 10*3/uL (ref 0.0–0.5)
Eosinophils Relative: 0 %
HCT: 36.7 % (ref 36.0–46.0)
Hemoglobin: 12 g/dL (ref 12.0–15.0)
Immature Granulocytes: 1 %
Lymphocytes Relative: 11 %
Lymphs Abs: 0.7 10*3/uL (ref 0.7–4.0)
MCH: 31.2 pg (ref 26.0–34.0)
MCHC: 32.7 g/dL (ref 30.0–36.0)
MCV: 95.3 fL (ref 80.0–100.0)
Monocytes Absolute: 0.4 10*3/uL (ref 0.1–1.0)
Monocytes Relative: 7 %
Neutro Abs: 5.1 10*3/uL (ref 1.7–7.7)
Neutrophils Relative %: 81 %
Platelets: 329 10*3/uL (ref 150–400)
RBC: 3.85 MIL/uL — ABNORMAL LOW (ref 3.87–5.11)
RDW: 14.6 % (ref 11.5–15.5)
WBC: 6.2 10*3/uL (ref 4.0–10.5)
nRBC: 0 % (ref 0.0–0.2)

## 2020-11-27 LAB — BLOOD GAS, ARTERIAL
Acid-base deficit: 14.4 mmol/L — ABNORMAL HIGH (ref 0.0–2.0)
Bicarbonate: 12.2 mmol/L — ABNORMAL LOW (ref 20.0–28.0)
FIO2: 100
MECHVT: 400 mL
O2 Saturation: 92.1 %
PEEP: 14 cmH2O
Patient temperature: 98.6
RATE: 28 resp/min
pCO2 arterial: 32.2 mmHg (ref 32.0–48.0)
pH, Arterial: 7.204 — ABNORMAL LOW (ref 7.350–7.450)
pO2, Arterial: 90.3 mmHg (ref 83.0–108.0)

## 2020-11-27 LAB — COMPREHENSIVE METABOLIC PANEL
ALT: 25 U/L (ref 0–44)
AST: 73 U/L — ABNORMAL HIGH (ref 15–41)
Albumin: 2.9 g/dL — ABNORMAL LOW (ref 3.5–5.0)
Alkaline Phosphatase: 89 U/L (ref 38–126)
Anion gap: 18 — ABNORMAL HIGH (ref 5–15)
BUN: 44 mg/dL — ABNORMAL HIGH (ref 6–20)
CO2: 17 mmol/L — ABNORMAL LOW (ref 22–32)
Calcium: 8.2 mg/dL — ABNORMAL LOW (ref 8.9–10.3)
Chloride: 94 mmol/L — ABNORMAL LOW (ref 98–111)
Creatinine, Ser: 1.36 mg/dL — ABNORMAL HIGH (ref 0.44–1.00)
GFR, Estimated: 45 mL/min — ABNORMAL LOW (ref >60)
Glucose, Bld: 385 mg/dL — ABNORMAL HIGH (ref 70–99)
Potassium: 5.3 mmol/L — ABNORMAL HIGH (ref 3.5–5.1)
Sodium: 129 mmol/L — ABNORMAL LOW (ref 135–145)
Total Bilirubin: 0.5 mg/dL (ref 0.3–1.2)
Total Protein: 7.1 g/dL (ref 6.5–8.1)

## 2020-11-27 LAB — BASIC METABOLIC PANEL
Anion gap: 23 — ABNORMAL HIGH (ref 5–15)
BUN: 44 mg/dL — ABNORMAL HIGH (ref 6–20)
CO2: 9 mmol/L — ABNORMAL LOW (ref 22–32)
Calcium: 7.3 mg/dL — ABNORMAL LOW (ref 8.9–10.3)
Chloride: 104 mmol/L (ref 98–111)
Creatinine, Ser: 1.43 mg/dL — ABNORMAL HIGH (ref 0.44–1.00)
GFR, Estimated: 43 mL/min — ABNORMAL LOW (ref >60)
Glucose, Bld: 495 mg/dL — ABNORMAL HIGH (ref 70–99)
Potassium: 4.5 mmol/L (ref 3.5–5.1)
Sodium: 136 mmol/L (ref 135–145)

## 2020-11-27 LAB — BETA-HYDROXYBUTYRIC ACID: Beta-Hydroxybutyric Acid: 0.89 mmol/L — ABNORMAL HIGH (ref 0.05–0.27)

## 2020-11-27 LAB — FERRITIN: Ferritin: 1472 ng/mL — ABNORMAL HIGH (ref 11–307)

## 2020-11-27 LAB — CBG MONITORING, ED
Glucose-Capillary: 143 mg/dL — ABNORMAL HIGH (ref 70–99)
Glucose-Capillary: 364 mg/dL — ABNORMAL HIGH (ref 70–99)
Glucose-Capillary: 365 mg/dL — ABNORMAL HIGH (ref 70–99)
Glucose-Capillary: 394 mg/dL — ABNORMAL HIGH (ref 70–99)
Glucose-Capillary: 457 mg/dL — ABNORMAL HIGH (ref 70–99)

## 2020-11-27 LAB — I-STAT BETA HCG BLOOD, ED (MC, WL, AP ONLY): I-stat hCG, quantitative: <5 m[IU]/mL (ref <5)

## 2020-11-27 LAB — HEMOGLOBIN A1C
Hgb A1c MFr Bld: 10.7 % — ABNORMAL HIGH (ref 4.8–5.6)
Mean Plasma Glucose: 260.39 mg/dL

## 2020-11-27 LAB — C-REACTIVE PROTEIN: CRP: 23.6 mg/dL — ABNORMAL HIGH (ref <1.0)

## 2020-11-27 LAB — TRIGLYCERIDES
Triglycerides: 217 mg/dL — ABNORMAL HIGH (ref <150)
Triglycerides: 185 mg/dL — ABNORMAL HIGH (ref <150)

## 2020-11-27 LAB — D-DIMER, QUANTITATIVE: D-Dimer, Quant: 17.52 ug/mL-FEU — ABNORMAL HIGH (ref 0.00–0.50)

## 2020-11-27 LAB — FIBRINOGEN: Fibrinogen: 709 mg/dL — ABNORMAL HIGH (ref 210–475)

## 2020-11-27 LAB — LACTIC ACID, PLASMA
Lactic Acid, Venous: 7.5 mmol/L (ref 0.5–1.9)
Lactic Acid, Venous: 8.3 mmol/L (ref 0.5–1.9)

## 2020-11-27 LAB — RESP PANEL BY RT-PCR (FLU A&B, COVID) ARPGX2
Influenza A by PCR: NEGATIVE
Influenza B by PCR: NEGATIVE
SARS Coronavirus 2 by RT PCR: POSITIVE — AB

## 2020-11-27 LAB — PROCALCITONIN: Procalcitonin: 0.52 ng/mL

## 2020-11-27 LAB — LACTATE DEHYDROGENASE: LDH: 941 U/L — ABNORMAL HIGH (ref 98–192)

## 2020-11-27 LAB — TROPONIN I (HIGH SENSITIVITY): Troponin I (High Sensitivity): 380 ng/L (ref <18)

## 2020-11-27 MED ORDER — HEPARIN (PORCINE) 25000 UT/250ML-% IV SOLN
800.0000 [IU]/h | INTRAVENOUS | Status: DC
  Administered 2020-11-27: 1000 [IU]/h via INTRAVENOUS
  Administered 2020-11-28: 800 [IU]/h via INTRAVENOUS
  Filled 2020-11-27 (×2): qty 250

## 2020-11-27 MED ORDER — PREDNISONE 20 MG PO TABS: 50.0000 mg | ORAL_TABLET | Freq: Every day | ORAL | Status: DC

## 2020-11-27 MED ORDER — DEXTROSE IN LACTATED RINGERS 5 % IV SOLN: INTRAVENOUS | Status: DC

## 2020-11-27 MED ORDER — EPINEPHRINE 1 MG/10ML IJ SOSY
PREFILLED_SYRINGE | INTRAMUSCULAR | Status: AC | PRN
  Administered 2020-11-27 (×2): 1 mg via INTRAVENOUS

## 2020-11-27 MED ORDER — CHLORHEXIDINE GLUCONATE 0.12% ORAL RINSE (MEDLINE KIT)
15.0000 mL | Freq: Two times a day (BID) | OROMUCOSAL | Status: DC
  Administered 2020-11-28 – 2020-12-07 (×18): 15 mL via OROMUCOSAL

## 2020-11-27 MED ORDER — SODIUM CHLORIDE 0.9 % IV SOLN
1.0000 g | INTRAVENOUS | Status: AC
  Administered 2020-11-27 – 2020-12-03 (×7): 1 g via INTRAVENOUS
  Filled 2020-11-27 (×7): qty 10

## 2020-11-27 MED ORDER — LACTATED RINGERS IV SOLN: INTRAVENOUS | Status: DC

## 2020-11-27 MED ORDER — HEPARIN BOLUS VIA INFUSION
1500.0000 [IU] | Freq: Once | INTRAVENOUS | Status: AC
  Administered 2020-11-27: 1500 [IU] via INTRAVENOUS
  Filled 2020-11-27: qty 1500

## 2020-11-27 MED ORDER — LACTATED RINGERS IV BOLUS
30.0000 mL/kg | Freq: Once | INTRAVENOUS | Status: AC
  Administered 2020-11-27: 1797 mL via INTRAVENOUS

## 2020-11-27 MED ORDER — POLYETHYLENE GLYCOL 3350 17 G PO PACK
17.0000 g | PACK | Freq: Every day | ORAL | Status: DC
  Filled 2020-11-27: qty 1

## 2020-11-27 MED ORDER — PANTOPRAZOLE SODIUM 40 MG IV SOLR
40.0000 mg | Freq: Every day | INTRAVENOUS | Status: DC
  Administered 2020-11-27 – 2020-11-28 (×2): 40 mg via INTRAVENOUS
  Filled 2020-11-27 (×2): qty 40

## 2020-11-27 MED ORDER — SODIUM CHLORIDE 0.9 % IV SOLN
500.0000 mg | INTRAVENOUS | Status: AC
Start: 2020-11-27 — End: 2020-12-01
  Administered 2020-11-27 – 2020-12-01 (×5): 500 mg via INTRAVENOUS
  Filled 2020-11-27 (×7): qty 500

## 2020-11-27 MED ORDER — SODIUM CHLORIDE 0.9 % IV SOLN
100.0000 mg | Freq: Every day | INTRAVENOUS | Status: DC
  Administered 2020-11-28: 100 mg via INTRAVENOUS
  Filled 2020-11-27: qty 20

## 2020-11-27 MED ORDER — HEPARIN SODIUM (PORCINE) 5000 UNIT/ML IJ SOLN: 5000.0000 [IU] | Freq: Three times a day (TID) | INTRAMUSCULAR | Status: DC

## 2020-11-27 MED ORDER — DOCUSATE SODIUM 100 MG PO CAPS: 100.0000 mg | ORAL_CAPSULE | Freq: Two times a day (BID) | ORAL | Status: DC | PRN

## 2020-11-27 MED ORDER — FENTANYL CITRATE (PF) 100 MCG/2ML IJ SOLN
50.0000 ug | INTRAMUSCULAR | Status: DC | PRN
Start: 2020-11-27 — End: 2020-12-08
  Administered 2020-11-27: 100 ug via INTRAVENOUS
  Administered 2020-11-27: 150 ug via INTRAVENOUS
  Administered 2020-11-27 – 2020-12-03 (×6): 50 ug via INTRAVENOUS
  Administered 2020-12-05 – 2020-12-06 (×2): 100 ug via INTRAVENOUS
  Filled 2020-11-27: qty 4

## 2020-11-27 MED ORDER — PROPOFOL 1000 MG/100ML IV EMUL
5.0000 ug/kg/min | INTRAVENOUS | Status: DC
  Administered 2020-11-27: 5 ug/kg/min via INTRAVENOUS
  Administered 2020-11-28: 40 ug/kg/min via INTRAVENOUS
  Administered 2020-11-28: 35 ug/kg/min via INTRAVENOUS
  Administered 2020-11-28: 25 ug/kg/min via INTRAVENOUS
  Administered 2020-11-29 – 2020-12-01 (×9): 40 ug/kg/min via INTRAVENOUS
  Administered 2020-12-01: 35 ug/kg/min via INTRAVENOUS
  Administered 2020-12-01 (×2): 40 ug/kg/min via INTRAVENOUS
  Administered 2020-12-02 (×3): 30 ug/kg/min via INTRAVENOUS
  Administered 2020-12-03: 35 ug/kg/min via INTRAVENOUS
  Administered 2020-12-03 (×2): 40 ug/kg/min via INTRAVENOUS
  Administered 2020-12-03 – 2020-12-04 (×2): 35 ug/kg/min via INTRAVENOUS
  Filled 2020-11-27 (×12): qty 100
  Filled 2020-11-27: qty 200
  Filled 2020-11-27 (×10): qty 100

## 2020-11-27 MED ORDER — SODIUM CHLORIDE 0.9 % IV BOLUS
500.0000 mL | Freq: Once | INTRAVENOUS | Status: AC
  Administered 2020-11-27: 500 mL via INTRAVENOUS

## 2020-11-27 MED ORDER — ACETAMINOPHEN 325 MG PO TABS: 650.0000 mg | ORAL_TABLET | Freq: Four times a day (QID) | ORAL | Status: DC | PRN

## 2020-11-27 MED ORDER — METHYLPREDNISOLONE SODIUM SUCC 125 MG IJ SOLR
1.0000 mg/kg | Freq: Two times a day (BID) | INTRAMUSCULAR | Status: DC
Start: 2020-11-27 — End: 2020-11-29
  Administered 2020-11-27 – 2020-11-29 (×4): 60 mg via INTRAVENOUS
  Filled 2020-11-27 (×4): qty 2

## 2020-11-27 MED ORDER — DEXAMETHASONE SODIUM PHOSPHATE 10 MG/ML IJ SOLN
6.0000 mg | Freq: Once | INTRAMUSCULAR | Status: AC
  Administered 2020-11-27: 6 mg via INTRAVENOUS
  Filled 2020-11-27: qty 1

## 2020-11-27 MED ORDER — ASPIRIN EC 81 MG PO TBEC: 81.0000 mg | DELAYED_RELEASE_TABLET | Freq: Every day | ORAL | Status: DC

## 2020-11-27 MED ORDER — INSULIN REGULAR(HUMAN) IN NACL 100-0.9 UT/100ML-% IV SOLN
INTRAVENOUS | Status: AC
  Administered 2020-11-27: 11 [IU]/h via INTRAVENOUS
  Administered 2020-11-28: 1.1 [IU]/h via INTRAVENOUS
  Filled 2020-11-27 (×2): qty 100

## 2020-11-27 MED ORDER — POLYETHYLENE GLYCOL 3350 17 G PO PACK: 17.0000 g | PACK | Freq: Every day | ORAL | Status: DC | PRN

## 2020-11-27 MED ORDER — SODIUM CHLORIDE 0.9 % IV SOLN
200.0000 mg | Freq: Once | INTRAVENOUS | Status: AC
  Administered 2020-11-27: 200 mg via INTRAVENOUS
  Filled 2020-11-27: qty 40

## 2020-11-27 MED ORDER — DOCUSATE SODIUM 50 MG/5ML PO LIQD
100.0000 mg | Freq: Two times a day (BID) | ORAL | Status: DC
  Filled 2020-11-27: qty 10

## 2020-11-27 MED ORDER — SODIUM CHLORIDE (PF) 0.9 % IJ SOLN
INTRAMUSCULAR | Status: AC
  Filled 2020-11-27: qty 50

## 2020-11-27 MED ORDER — SODIUM CHLORIDE 0.9 % IV SOLN: Freq: Once | INTRAVENOUS | Status: AC

## 2020-11-27 MED ORDER — NOREPINEPHRINE 4 MG/250ML-% IV SOLN
2.0000 ug/min | INTRAVENOUS | Status: DC
  Administered 2020-11-27: 2 ug/min via INTRAVENOUS
  Administered 2020-11-29: 1.5 ug/min via INTRAVENOUS
  Filled 2020-11-27 (×3): qty 250

## 2020-11-27 MED ORDER — IOHEXOL 350 MG/ML SOLN
80.0000 mL | Freq: Once | INTRAVENOUS | Status: AC | PRN
  Administered 2020-11-27: 80 mL via INTRAVENOUS

## 2020-11-27 MED ORDER — ORAL CARE MOUTH RINSE
15.0000 mL | OROMUCOSAL | Status: DC
  Administered 2020-11-28 – 2020-12-07 (×98): 15 mL via OROMUCOSAL

## 2020-11-27 MED ORDER — DEXTROSE 50 % IV SOLN: 0.0000 mL | INTRAVENOUS | Status: DC | PRN

## 2020-11-27 MED ORDER — FENTANYL CITRATE (PF) 100 MCG/2ML IJ SOLN
50.0000 ug | INTRAMUSCULAR | Status: AC | PRN
  Administered 2020-11-27 – 2020-11-28 (×3): 50 ug via INTRAVENOUS
  Filled 2020-11-27 (×5): qty 2

## 2020-11-27 MED ORDER — SODIUM CHLORIDE 0.9 % IV SOLN: 250.0000 mL | INTRAVENOUS | Status: DC

## 2020-11-27 NOTE — ED Provider Notes (Signed)
[facility] DEPT Provider Note   CSN: [id number] Arrival date & time: 12/02/2020  1425     History Chief Complaint  Patient presents with  . Covid Exposure  . Shortness of Breath    Abigail [name] is a 59 y.o. female.  59 year old female with prior medical history detailed below presents for evaluation.  Patient reports that she has a husband at home who tested positive for Covid 2 weeks prior.  Patient is unvaccinated for Covid per report.  Patient reports 1 to 1-1/2 weeks of increasing fatigue and shortness of breath.  Patient reports cough.  She has not yet obtained a Covid test.  Patient presents today complaining of worsening shortness of breath and weakness.  She denies pain.  She denies recent fever.    The history is provided by the patient and medical records.  Shortness of Breath Severity:  Severe Onset quality:  Gradual Duration:  2 weeks Timing:  Constant Progression:  Worsening Chronicity:  New Context: activity   Relieved by:  Nothing Worsened by:  Exertion Associated symptoms: cough        Past Medical History:  Diagnosis Date  . Bipolar disorder ([place])   . CAD S/P percutaneous coronary angioplasty 04/06/2016   2 Vessel CAD: Ost-prox-mid RCA (80%, 80% & 99%) -> Synergy DES 2.5 x 38, mid-dist RCA -> Synergy DES 2.25 x 20.  mid LAD 80% -> Synergy DES 3.0 x 12 (3.3 mm), dist LAD 85%, D1 occluded-> Synergy DES 3.0 x 12 (3.3 mm)   . Cancer ([place])    uterus  . Diabetes mellitus without complication ([place])   . Hypertriglyceridemia   . Non-STEMI (non-ST elevated myocardial infarction) ([place]) 03/2016   Found to have severe 2 vessel disease involving the ostial to mid RCA and then mid to distal RCA as well as mid and distal LAD. - 4 site PCI    Patient Active Problem List   Diagnosis Date Noted  . CAD S/P percutaneous coronary angioplasty 05/19/2017  . Precordial pain   . NSTEMI (non-ST elevated myocardial infarction) ([place])  04/13/2016  . Hyperlipidemia with target low density lipoprotein (LDL) cholesterol less than 70 mg/dL   . Diabetes mellitus, type II, insulin dependent ([place]) 04/12/2016  . Bipolar 1 disorder ([place]) 04/12/2016  . Former smoker 04/12/2016  . History of uterine cancer 04/12/2016  . Noncompliance 04/12/2016    Past Surgical History:  Procedure Laterality Date  . ABDOMINAL HYSTERECTOMY    . CARDIAC CATHETERIZATION N/A 04/14/2016   Procedure: Left Heart Cath and Coronary Angiography;  Surgeon: Troy Sine, MD;  Location: [facility];  Service: Cardiovascular: NSTEMI -> ost RCA 80%, prox RCA 80%, mid RCA 99%, mid-dist RCA 85% (pre bifurcation). Mid LAD-Diag1 85% (with D1 85% - occluded post PCI of LAD, small caliber), dist LAD 85%.  [name] CARDIAC CATHETERIZATION N/A 04/14/2016   Procedure: Coronary Stent Intervention;  Surgeon: Troy Sine, MD;  Location: [facility]: 2 Vessel, 2 non-overlapping stents each: ost-mid RCA (3 lesions) - Synergy DES 2.5 mm x 38 mm, mid-dist RCA - Syergy DES 2.25 mm x 20 mm. mid LAD 3.0 mm x 12 mm (3.3 mm), dist LAD Synergy DES 3.0 mm x 24 mm (3.3 mm) -loss of flow and small D1  . TRANSTHORACIC ECHOCARDIOGRAM  03/2016   Post NSTEMI:  Normal LV size and function. EF 55-60%. Normal diastolic parameters. Mild LA dilation. Trivial AI     OB History   No  obstetric history on file.     Family History  Problem Relation Age of Onset  . CAD Mother        s/p CABG  . Heart disease Father   . Prostate cancer Father   . Heart attack Maternal Grandfather   . Heart attack Paternal Grandfather     Social History   Tobacco Use  . Smoking status: Former Smoker    Quit date: 04/25/2016    Years since quitting: 4.5  . Smokeless tobacco: Never Used  . Tobacco comment: quit smoking in Apr 2017  Substance Use Topics  . Alcohol use: No    Alcohol/week: 0.0 standard drinks  . Drug use: No    Home Medications Prior to Admission medications   Medication Sig  Start Date End Date Taking? Authorizing Provider  atorvastatin (LIPITOR) 80 MG tablet Take 1 tablet (80 mg total) by mouth daily at 6 PM. 04/16/16   Theodis Blaze, MD  blood glucose meter kit and supplies KIT Dispense based on patient and insurance preference. Use up to four times daily as directed. (FOR ICD-9 250.00, 250.01). 04/16/16   Theodis Blaze, MD  BRILINTA 60 MG TABS tablet TAKE 1 TABLET BY MOUTH TWICE DAILY **PT  NEEDS  APPOINTMENT** 05/15/19   Leonie Man, MD  BRILINTA 90 MG TABS tablet TAKE 1 TABLET BY MOUTH TWICE DAILY 05/14/18   Leonie Man, MD  docusate sodium (STOOL SOFTENER) 100 MG capsule Take 100 mg by mouth 2 (two) times daily.    [provider]  empagliflozin (JARDIANCE) 25 MG TABS tablet Take 25 mg by mouth daily.    [provider]  fenofibrate 160 MG tablet Take 1 tablet (160 mg total) by mouth daily. 04/16/16   Theodis Blaze, MD  ferrous sulfate 324 (65 Fe) MG TBEC Take by mouth.    [provider]  insulin glargine (LANTUS) 100 UNIT/ML injection Inject 0.1 mLs (10 Units total) into the skin at bedtime. Patient taking differently: Inject 14-17 Units into the skin at bedtime.  04/16/16   Theodis Blaze, MD  levothyroxine (SYNTHROID, LEVOTHROID) 75 MCG tablet Take 75 mcg by mouth daily before breakfast.    [provider]  losartan (COZAAR) 25 MG tablet Take 25 mg by mouth daily.    [provider]  metFORMIN (GLUCOPHAGE) 500 MG tablet Take 1,000 mg by mouth 2 (two) times daily with a meal.    [provider]    Allergies    Patient has no known allergies.  Review of Systems   Review of Systems  Respiratory: Positive for cough and shortness of breath.   All other systems reviewed and are negative.   Physical Exam Updated Vital Signs BP (!) 129/100   Pulse 79   Temp (!) 97.5 F (36.4 C) (Oral)   Resp (!) 37   Ht _0  (1.575 m)   Wt 59.9 kg   SpO2 (!) 82%   BMI 24.15 kg/m   Physical Exam Vitals  and nursing note reviewed.  Constitutional:      General: She is not in acute distress.    Appearance: She is well-developed and well-nourished.  HENT:     Head: Normocephalic and atraumatic.     Mouth/Throat:     Mouth: Oropharynx is clear and moist.  Eyes:     Extraocular Movements: EOM normal.     Conjunctiva/sclera: Conjunctivae normal.     Pupils: Pupils are equal, round, and reactive to  light.  Cardiovascular:     Rate and Rhythm: Normal rate and regular rhythm.     Heart sounds: Normal heart sounds.  Pulmonary:     Effort: Tachypnea present. No respiratory distress.     Comments: Decrease BS at bilateral bases  Abdominal:     General: There is no distension.     Palpations: Abdomen is soft.     Tenderness: There is no abdominal tenderness.  Musculoskeletal:        General: No deformity or edema. Normal range of motion.     Cervical back: Normal range of motion and neck supple.  Skin:    General: Skin is warm and dry.  Neurological:     General: No focal deficit present.     Mental Status: She is alert and oriented to person, place, and time.  Psychiatric:        Mood and Affect: Mood and affect normal.     ED Results / Procedures / Treatments   Labs (all labs ordered are listed, but only abnormal results are displayed) Labs Reviewed  LACTIC ACID, PLASMA - Abnormal; Notable for the following components:      Result Value   Lactic Acid, Venous 7.5 (*)    All other components within normal limits  CBC WITH DIFFERENTIAL/PLATELET - Abnormal; Notable for the following components:   RBC 3.85 (*)    All other components within normal limits  COMPREHENSIVE METABOLIC PANEL - Abnormal; Notable for the following components:   Sodium 129 (*)    Potassium 5.3 (*)    Chloride 94 (*)    CO2 17 (*)    Glucose, Bld 385 (*)    BUN 44 (*)    Creatinine, Ser 1.36 (*)    Calcium 8.2 (*)    Albumin 2.9 (*)    AST 73 (*)    GFR, Estimated 45 (*)    Anion gap 18 (*)    All  other components within normal limits  D-DIMER, QUANTITATIVE (NOT AT [facility]) - Abnormal; Notable for the following components:   D-Dimer, Quant 17.52 (*)    All other components within normal limits  LACTATE DEHYDROGENASE - Abnormal; Notable for the following components:   LDH 941 (*)    All other components within normal limits  FERRITIN - Abnormal; Notable for the following components:   Ferritin 1,472 (*)    All other components within normal limits  TRIGLYCERIDES - Abnormal; Notable for the following components:   Triglycerides 217 (*)    All other components within normal limits  FIBRINOGEN - Abnormal; Notable for the following components:   Fibrinogen 709 (*)    All other components within normal limits  C-REACTIVE PROTEIN - Abnormal; Notable for the following components:   CRP 23.6 (*)    All other components within normal limits  RESP PANEL BY RT-PCR (FLU A&B, COVID) ARPGX2  CULTURE, BLOOD (ROUTINE X 2)  CULTURE, BLOOD (ROUTINE X 2)  PROCALCITONIN  LACTIC ACID, PLASMA  I-STAT BETA HCG BLOOD, ED ([facility], [facility], [facility] ONLY)    EKG EKG Interpretation  Date/Time:  Saturday November 27 2020 15:59:09 EST Ventricular Rate:  79 PR Interval:    QRS Duration: 88 QT Interval:  403 QTC Calculation: 462 R Axis:   81 Text Interpretation: Sinus rhythm Borderline T abnormalities, inferior leads Confirmed by Dene Gentry [id number]) on 12/11/2020 4:00:22 PM   Radiology DG Chest Port 1 View  Result Date: 11/22/2020 CLINICAL DATA:  Dyspnea, covid positive EXAM: PORTABLE  CHEST 1 VIEW.  Patient is slightly rotated. COMPARISON:  Chest x-ray 04/12/2016. FINDINGS: The heart size and mediastinal contours are within normal limits. Suggestion of a coronary artery stent. Diffuse vague patchy airspace opacities. No pulmonary edema. No pleural effusion. No pneumothorax. No acute osseous abnormality. IMPRESSION: Diffuse vague patchy airspace opacities consistent with multifocal pneumonia in the setting of  COVID infection. Electronically Signed   By: Iven Finn M.D.   On: 12/13/2020 16:34    Procedures Procedures (including critical care time)  CRITICAL CARE Performed by: Valarie Merino   Total critical care time: 30 minutes  Critical care time was exclusive of separately billable procedures and treating other patients.  Critical care was necessary to treat or prevent imminent or life-threatening deterioration.  Critical care was time spent personally by me on the following activities: development of treatment plan with patient and/or surrogate as well as nursing, discussions with consultants, evaluation of patient's response to treatment, examination of patient, obtaining history from patient or surrogate, ordering and performing treatments and interventions, ordering and review of laboratory studies, ordering and review of radiographic studies, pulse oximetry and re-evaluation of patient's condition.   Medications Ordered in ED Medications  remdesivir 200 mg in sodium chloride 0.9% 250 mL IVPB (200 mg Intravenous New Bag/Given 11/30/2020 1604)    Followed by  remdesivir 100 mg in sodium chloride 0.9 % 100 mL IVPB (has no administration in time range)  sodium chloride 0.9 % bolus 500 mL (has no administration in time range)  dexamethasone (DECADRON) injection 6 mg (6 mg Intravenous Given 11/23/2020 1602)    ED Course  I have reviewed the triage vital signs and the nursing notes.  Pertinent labs & imaging results that were available during my care of the patient were reviewed by me and considered in my medical decision making (see chart for details).    MDM Rules/Calculators/A&P                          MDM  Screen complete  KIRSTYN LEAN was evaluated in Emergency Department on 11/20/2020 for the symptoms described in the history of present illness. She was evaluated in the context of the global COVID-19 pandemic, which necessitated consideration that the patient  might be at risk for infection with the SARS-CoV-2 virus that causes COVID-19. Institutional protocols and algorithms that pertain to the evaluation of patients at risk for COVID-19 are in a state of rapid change based on information released by regulatory bodies including the CDC and federal and state organizations. These policies and algorithms were followed during the patient's care in the ED.  Patient presented for evaluation of reported shortness of breath.  Patient reports recent exposure to Covid positive patient - her husband.  Patient's symptoms have been ongoing for the last 2 weeks.  Patient is noted to be significantly hypoxic in triage.  Hypoxia improved with supplemental oxygen.  Patient with significant FiO2 requirement.  She does appear to be comfortable on high FiO2.  Initial labs showed evidence of likely Covid section with significant pneumonia and high FiO2.  Additionally patient with evidence of dehydration with elevated lactic acid and open anion gap.  Attempted case discussion with [facility] for admission at 1700. They request Covid result prior to admission via Epic test feature. No call back from hospitalist service.  Patient was discussed with CCM (Dr. [name]) given high FIO2 requirement. She agrees to see patient in the ED at 1810.  Patient appears comfortable at this time - despite high Fi02.  When Dr. [name] entered the patient's room at approximately 1830 to evaluate the patient - she found the patient to be apneic, pulseless, and in asystole.  Patient apparently had her face mask and nasal cannula still in place. Full cardiac monitor/pulse Ox monitor were still in place.  Dr. [name] called a CODE BLUE upon finding the patient in arrest.  Patient was resuscitated with Dr. [name] leading resuscitation.   Patient to be admitted to ICU with respiratory arrest/cardiac arrest in the setting of presumed Covid infection.    Final Clinical Impression(s) / ED  Diagnoses Final diagnoses:  Dyspnea, unspecified type  Respiratory arrest [facility])    Rx / DC Orders ED Discharge Orders    None       Valarie Merino, MD 12/04/2020 1911

## 2020-11-27 NOTE — ED Notes (Signed)
E-link paged due to pt breathing around tube and sedation ineffective at this time. Awaiting call back

## 2020-11-27 NOTE — H&P (Signed)
NAME:  Abigail [name], MRN:  [id number], DOB:  04/04/1961, LOS: 0 ADMISSION DATE:  11/18/2020, CONSULTATION DATE:  12/06/2020 REFERRING MD:  [facility], CHIEF COMPLAINT:  Hypoxic respiratory failure  Brief History   Husband with covid, SOB and feeling poorly x 2 weeks  History of present illness   Abigail [name] is a 59 year old woman who has been feeling ill for about 2 weeks.  Her husband has Covid he has not yet been tested.  Her daughters are worried about her and let by the house today to check on her.  They were concerned about her breathing and called EMS to transport her to the hospital.  Upon arrival she had a lactic acidosis, saturations in the 70s requiring 15 L salter and nonrebreather mask to maintain saturations in the 90s.  Unable to obtain history the patient due to acuity of condition.  Had a PEA cardiac arrest in the emergency department, intubated during the code.  Per her daughter Abigail [name] she is very frail at baseline, but is independent in her ADLs.  She is able to walk around shopping, but would not be able to walk a mile.  Past Medical History  DM Cancer Bipolar HLD NSTEMI  Significant Hospital Events   Cardiac arrest - PEA 12/11  Consults:    Procedures:  ETT 12/11 Aline 12/11  Significant Diagnostic Tests:    Micro Data:  Covid pending 12/11 blood cultures 12/11 trach aspirate  Antimicrobials:  Ceftriaxone 12/11> Azithromycin 12/11>  Interim history/subjective:    Objective   Blood pressure 110/75, pulse 95, temperature (!) 97.5 F (36.4 C), resp. rate (!) 38, height _0  (1.575 m), weight 59.9 kg, SpO2 (!) 84 %.       No intake or output data in the 24 hours ending 12/03/2020 1811 Filed Weights   12/10/2020 1518  Weight: 59.9 kg    Examination: General: Ill-appearing woman lying in bed intubated, sedated HENT: [place]/AT, eyes anicteric, intubated Lungs: Breathing synchronously with the ventilator Cardiovascular: Tachycardic, regular  rhythm Abdomen: Distended, soft Extremities: No peripheral edema Neuro: RASS -5, occasionally taking agonal breaths above the vent GU: Normal external genitalia  CXR personally revieweed> possibly LLL retrocardiac infiltrate with air bronchograms  Resolved Hospital Problem list     Assessment & Plan:  Cardiac arrest- PEA/ asystole, suspect hypoxia related? -CTA to rule out PE; empiric full dose AC for now -holding sedation until demonstrating need for sedation; minimal sedation to allow for neuroprognosticaiton -tele monitoring -EKG, trop  Acute hypoxic respiratory failure requiring MV; possibly superimposed LLL infiltrate Likely covid pneumonia; viral panel pending -airborne precautions -empiric CAP antibiotics given elevated PCT. IVF, blood cx, abx ordered. -LTVV, 4-8cc/KG ideal body weight with goal plateau less than 30 and driving pressure less than 15. -Follow-up post intubation ABG in 1 hour -Titrate PEEP and FiO2 per ARDS protocol. -VAP prevention protocol -Sedation only as required.  Try to minimize as much as possible to facilitate neuro prognostication.  No paralytics or sedatives given at the time of intubation.  Hyperglycemia, uncontrolled diabetes -Insulin drip -Resuscitation fluids -checking betahydroxybutyrate & A1c  Lactic acidosis -Serial lactates -Fluid resuscitation as ordered  Hyponatremia likely due to acute illness, poor p.o. intake recently -Hyperkalemia likely due to acidosis -AKI, likely prerenal  Elevated transaminases, likely due to sepsis -Continue to monitor  Elevated D-dimer -CTA -empiric full dose [facility]  Daughter Abigail [name] called and updated.  She confirms that family would want full resuscitation measures at this time.  We discussed the poor  prognosis of Covid and severe respiratory failure requiring mechanical ventilation steroids with anoxic sequela of cardiac arrest.  We will continue to have ongoing goals of care discussions  forthcoming.   Best practice (evaluated daily)   Diet: NPO Pain/Anxiety/Delirium protocol (if indicated): ordered VAP protocol (if indicated): yes DVT prophylaxis: heparin [place] GI prophylaxis: pantoprazole Glucose control: insulin gtt Mobility: bedrest last date of multidisciplinary goals of care discussion Family and staff present  Summary of discussion  Follow up goals of care discussion due Code Status: full Disposition: ICU  Labs   CBC: Recent Labs  Lab 11/26/2020 1527  WBC 6.2  NEUTROABS 5.1  HGB 12.0  HCT 36.7  MCV 95.3  PLT 297    Basic Metabolic Panel: Recent Labs  Lab 12/05/2020 1527  NA 129*  K 5.3*  CL 94*  CO2 17*  GLUCOSE 385*  BUN 44*  CREATININE 1.36*  CALCIUM 8.2*   GFR: Estimated Creatinine Clearance: 35.7 mL/min (A) (by C-G formula based on SCr of 1.36 mg/dL (H)). Recent Labs  Lab 12/09/2020 1527  PROCALCITON 0.52  WBC 6.2  LATICACIDVEN 7.5*    Liver Function Tests: Recent Labs  Lab 11/29/2020 1527  AST 73*  ALT 25  ALKPHOS 89  BILITOT 0.5  PROT 7.1  ALBUMIN 2.9*   No results for input(s): LIPASE, AMYLASE in the last 168 hours. No results for input(s): AMMONIA in the last 168 hours.  ABG No results found for: PHART, PCO2ART, PO2ART, HCO3, TCO2, ACIDBASEDEF, O2SAT   Coagulation Profile: No results for input(s): INR, PROTIME in the last 168 hours.  Cardiac Enzymes: No results for input(s): CKTOTAL, CKMB, CKMBINDEX, TROPONINI in the last 168 hours.  HbA1C: Hgb A1c MFr Bld  Date/Time Value Ref Range Status  04/13/2016 12:55 AM 13.8 (H) 4.8 - 5.6 % Final    Comment:    (NOTE)         Pre-diabetes: 5.7 - 6.4         Diabetes: >6.4         Glycemic control for adults with diabetes: <7.0     CBG: No results for input(s): GLUCAP in the last 168 hours.  Review of Systems:   Unable to be performed due to mental status  Past Medical History  She,  has a past medical history of Bipolar disorder ([place]), CAD S/P percutaneous  coronary angioplasty (04/06/2016), Cancer ([place]), Diabetes mellitus without complication ([place]), Hypertriglyceridemia, and Non-STEMI (non-ST elevated myocardial infarction) ([place]) (03/2016).   Surgical History    Past Surgical History:  Procedure Laterality Date  . ABDOMINAL HYSTERECTOMY    . CARDIAC CATHETERIZATION N/A 04/14/2016   Procedure: Left Heart Cath and Coronary Angiography;  Surgeon: Troy Sine, MD;  Location: [facility];  Service: Cardiovascular: NSTEMI -> ost RCA 80%, prox RCA 80%, mid RCA 99%, mid-dist RCA 85% (pre bifurcation). Mid LAD-Diag1 85% (with D1 85% - occluded post PCI of LAD, small caliber), dist LAD 85%.  [name] CARDIAC CATHETERIZATION N/A 04/14/2016   Procedure: Coronary Stent Intervention;  Surgeon: Troy Sine, MD;  Location: [facility]: 2 Vessel, 2 non-overlapping stents each: ost-mid RCA (3 lesions) - Synergy DES 2.5 mm x 38 mm, mid-dist RCA - Syergy DES 2.25 mm x 20 mm. mid LAD 3.0 mm x 12 mm (3.3 mm), dist LAD Synergy DES 3.0 mm x 24 mm (3.3 mm) -loss of flow and small D1  . TRANSTHORACIC ECHOCARDIOGRAM  03/2016   Post NSTEMI:  Normal LV size and  function. EF 55-60%. Normal diastolic parameters. Mild LA dilation. Trivial AI     Social History   reports that she quit smoking about 4 years ago. She has never used smokeless tobacco. She reports that she does not drink alcohol and does not use drugs.   Family History   Her family history includes CAD in her mother; Heart attack in her maternal grandfather and paternal grandfather; Heart disease in her father; Prostate cancer in her father.   Allergies No Known Allergies   Home Medications  Prior to Admission medications   Medication Sig Start Date End Date Taking? Authorizing Provider  atorvastatin (LIPITOR) 80 MG tablet Take 1 tablet (80 mg total) by mouth daily at 6 PM. 04/16/16   Theodis Blaze, MD  blood glucose meter kit and supplies KIT Dispense based on patient and insurance preference. Use  up to four times daily as directed. (FOR ICD-9 250.00, 250.01). 04/16/16   Theodis Blaze, MD  BRILINTA 60 MG TABS tablet TAKE 1 TABLET BY MOUTH TWICE DAILY **PT  NEEDS  APPOINTMENT** 05/15/19   Leonie Man, MD  BRILINTA 90 MG TABS tablet TAKE 1 TABLET BY MOUTH TWICE DAILY 05/14/18   Leonie Man, MD  docusate sodium (STOOL SOFTENER) 100 MG capsule Take 100 mg by mouth 2 (two) times daily.    [provider]  empagliflozin (JARDIANCE) 25 MG TABS tablet Take 25 mg by mouth daily.    [provider]  fenofibrate 160 MG tablet Take 1 tablet (160 mg total) by mouth daily. 04/16/16   Theodis Blaze, MD  ferrous sulfate 324 (65 Fe) MG TBEC Take by mouth.    [provider]  insulin glargine (LANTUS) 100 UNIT/ML injection Inject 0.1 mLs (10 Units total) into the skin at bedtime. Patient taking differently: Inject 14-17 Units into the skin at bedtime.  04/16/16   Theodis Blaze, MD  levothyroxine (SYNTHROID, LEVOTHROID) 75 MCG tablet Take 75 mcg by mouth daily before breakfast.    [provider]  losartan (COZAAR) 25 MG tablet Take 25 mg by mouth daily.    [provider]  metFORMIN (GLUCOPHAGE) 500 MG tablet Take 1,000 mg by mouth 2 (two) times daily with a meal.    [provider]     Critical care time: 85 minutes      Julian Hy, DO 11/19/2020 7:59 PM [facility]

## 2020-11-27 NOTE — ED Notes (Signed)
Pt in asystole upon arrival of [place], DO. Multiple staff in the room. ED doc, Francia Greaves, MD bedside. CPR started. Respiratory called

## 2020-11-27 NOTE — ED Notes (Signed)
ROSC obtained.

## 2020-11-27 NOTE — Code Documentation (Signed)
I walked into the patient's room to evaluate her. Monitor reading asystole, pleth flat. O2 and tele leads remained in place. No pulses and nonresponsive. Code blue called and compressions started. BVM begun when a second provider was present in the room. ACLS initiated when additional providers available. Intubated with 7.5 ETT, epi x 2, chest compressions until ROSC achieved. ED physician present during code- confirmed patient had wanted to be full code. Daughter updated by ED staff during this event. Additional 0.5mg  epinephrine given x 1 due to having thready pulses and unknown BP. Aline placed, CXR performed showing ETT in appropriate position and no pneumothorax.    Julian Hy, DO 12/13/2020 7:17 PM [facility]

## 2020-11-27 NOTE — ED Notes (Signed)
Code called, CPR in progress

## 2020-11-27 NOTE — ED Triage Notes (Signed)
Pt came from via EMS. Lives with husband who tested COVID positive 2 weeks ago. Pt not been tested but been feeling unwell for 12 days. O2 sat 79% here on 15L NR.  NR 15L: 68% O2 sat RR: 52 146/72 PMH t2DM CBG: 449 97.3 temp ETCO2: 13

## 2020-11-27 NOTE — Procedures (Signed)
Intubation Procedure Note  Abigail [name]  [id number]  02-10-1961  Date:12/17/2020  Time:7:36 PM   Provider Performing:Lamyra Malcolm P [name]    Procedure: Intubation (59163)  Indication(s) Respiratory Failure  Consent Unable to obtain consent due to emergent nature of procedure.   Anesthesia none   Time Out Verified patient identification, verified procedure, site/side was marked, verified correct patient position, special equipment/implants available, medications/allergies/relevant history reviewed, required imaging and test results available.   Sterile Technique Usual hand hygeine, masks, and gloves were used   Procedure Description Patient positioned in bed supine.  Sedation given as noted above.  Patient was intubated with endotracheal tube using Glidescope.  View was Grade 2 only posterior commissure .  Number of attempts was 1.  Colorimetric CO2 detector was consistent with tracheal placement.   Complications/Tolerance None; patient tolerated the procedure well. Chest X-ray is ordered to verify placement.   EBL 0   Specimen(s) None  Julian Hy, DO 12/03/2020 7:37 PM [facility]

## 2020-11-27 NOTE — ED Notes (Signed)
ET tube and OG tube placement confirmed by portable chest x ray

## 2020-11-27 NOTE — ED Notes (Signed)
E-link currently on phone with this nurse at this time

## 2020-11-27 NOTE — ED Notes (Signed)
1 mg Epi in left forearm IV

## 2020-11-27 NOTE — ED Notes (Signed)
OG tube advanced 4cm

## 2020-11-27 NOTE — Progress Notes (Signed)
[facility] Progress Note Patient Name: Abigail [name] DOB: [birth year] MRN: [id number]   Date of Service  12/13/2020  HPI/Events of Note  Patient with suspected Covid 19 pneumonia and in-hospital cardiac arrest in the ED, she also has DKA and is on an insulin infusion. She is intubated and mechanically ventilated for acute hypoxemic respiratory failure in the context of severe Covid pneumonia and cardiac arrest. EKG is NSR with QTc 513 but no acute ST segment changes, CT PE negative for PE but shows extensive bilateral infiltrates c/w Covid, Lactic acis  8.3, Beta hydroxybutyrate 0.89.  eICU Interventions  New Patient Evaluation completed, trend lactic acid, and beta hydroxybutyrate. Troponin elevation likely secondary to CPR but will trend to r/o acute coronary syndrome. Sedation orders on the ventilator entered because patient is dyssynchronous and desaturating.        Kerry Kass Alesandro Stueve 11/29/2020, 9:32 PM

## 2020-11-27 NOTE — ED Notes (Signed)
Pt concerned about breast cancer

## 2020-11-27 NOTE — ED Notes (Signed)
Placed pt on [place] HF [place] @ 15 L, per RT

## 2020-11-27 NOTE — Procedures (Signed)
Arterial Catheter Insertion Procedure Note  JOHNAY MANO  [id number]  May 06, 1961  Date:11/20/2020  Time:7:37 PM    Provider Performing: Julian Hy    Procedure: Insertion of Arterial Line [phone]) with US guidance (56153)   Indication(s) Blood pressure monitoring and/or need for frequent ABGs  Consent Unable to obtain consent due to emergent nature of procedure.  Anesthesia <5cc   Time Out Verified patient identification, verified procedure, site/side was marked, verified correct patient position, special equipment/implants available, medications/allergies/relevant history reviewed, required imaging and test results available.   Sterile Technique Maximal sterile technique was not able to be achieved due to emergent nature of procedure.   Procedure Description Area of catheter insertion was cleaned with chlorhexidine and draped in sterile fashion. With real-time ultrasound guidance an arterial catheter was placed into the left radial artery.  Appropriate arterial tracings confirmed on monitor.     Complications/Tolerance None; patient tolerated the procedure well.   EBL Minimal   Specimen(s) None  Julian Hy, DO 12/09/2020 7:38 PM [facility]

## 2020-11-27 NOTE — ED Notes (Signed)
1 g Epi in left forearm IV

## 2020-11-27 NOTE — Progress Notes (Signed)
ANTICOAGULATION CONSULT NOTE - Initial Consult  Pharmacy Consult for IV heparin Indication: pulmonary embolus  No Known Allergies  Patient Measurements: Height: 5' 2"  (157.5 cm) Weight: 59.9 kg (132 lb 0.9 oz) IBW/kg (Calculated) : 50.1 Heparin Dosing Weight: 59.9 kg  Vital Signs: Temp: 97.5 F (36.4 C) (12/11 1751) Temp Source: Oral (12/11 1503) BP: 109/61 (12/11 1930) Pulse Rate: 80 (12/11 2000)  Labs: Recent Labs    11/20/2020 1527  HGB 12.0  HCT 36.7  PLT 329  CREATININE 1.36*    Estimated Creatinine Clearance: 35.7 mL/min (A) (by C-G formula based on SCr of 1.36 mg/dL (H)).   Medical History: Past Medical History:  Diagnosis Date  . Bipolar disorder ([place])   . CAD S/P percutaneous coronary angioplasty 04/06/2016   2 Vessel CAD: Ost-prox-mid RCA (80%, 80% & 99%) -> Synergy DES 2.5 x 38, mid-dist RCA -> Synergy DES 2.25 x 20.  mid LAD 80% -> Synergy DES 3.0 x 12 (3.3 mm), dist LAD 85%, D1 occluded-> Synergy DES 3.0 x 12 (3.3 mm)   . Cancer ([place])    uterus  . Diabetes mellitus without complication ([place])   . Hypertriglyceridemia   . Non-STEMI (non-ST elevated myocardial infarction) ([place]) 03/2016   Found to have severe 2 vessel disease involving the ostial to mid RCA and then mid to distal RCA as well as mid and distal LAD. - 4 site PCI    Medications:  Scheduled:  . [START ON 11/28/2020] aspirin EC  81 mg Oral Daily  . chlorhexidine gluconate (MEDLINE KIT)  15 mL Mouth Rinse BID  . docusate  100 mg Per Tube BID  . heparin  5,000 Units Subcutaneous Q8H  . mouth rinse  15 mL Mouth Rinse 10 times per day  . methylPREDNISolone (SOLU-MEDROL) injection  1 mg/kg Intravenous Q12H   Followed by  . [START ON 11/30/2020] predniSONE  50 mg Oral Daily  . pantoprazole (PROTONIX) IV  40 mg Intravenous QHS  . polyethylene glycol  17 g Per Tube Daily  . sodium chloride (PF)       Infusions:  . sodium chloride    . azithromycin 500 mg (11/25/2020 1925)  . cefTRIAXone  (ROCEPHIN)  IV 1 g (11/28/2020 1918)  . dextrose 5% lactated ringers    . insulin 11 Units/hr (11/21/2020 1941)  . lactated ringers    . norepinephrine (LEVOPHED) Adult infusion 2 mcg/min (11/30/2020 2001)  . [START ON 11/28/2020] remdesivir 100 mg in NS 100 mL      Assessment: 59 yo female with likely COVID PNA (husband with COVID), acute hypoxic respiratory failure to start IV heparin for probable PE per CT scan perlim results. Patient not on any anticoagulation PTA. Baseline CBC and SCr drawn.   Goal of Therapy:  Heparin level 0.3-0.7 units/ml Monitor platelets by anticoagulation protocol: Yes   Plan:   IV heparin bolus of 1500 units then  IV heparin rate of 1000 units/hr  Check heparin level 6 hours after start of IV heparin  Daily CBC   Kara Mead 12/17/2020,8:14 PM

## 2020-11-27 NOTE — ED Notes (Signed)
0.5 mg Epi in left forearm

## 2020-11-27 NOTE — ED Notes (Addendum)
Date and time results received: 11/19/2020 4:45 PM  (use smartphrase ".now" to insert current time)  Test: lactic acid  Critical Value: 7.5  Name of Provider Notified: Vicente Males RN,   Orders Received? Or Actions Taken?: Dr Francia Greaves

## 2020-11-28 ENCOUNTER — Encounter (HOSPITAL_COMMUNITY): Payer: Self-pay | Admitting: Critical Care Medicine

## 2020-11-28 ENCOUNTER — Inpatient Hospital Stay (HOSPITAL_COMMUNITY): Payer: BLUE CROSS/BLUE SHIELD

## 2020-11-28 DIAGNOSIS — Z515 Encounter for palliative care: Secondary | ICD-10-CM

## 2020-11-28 DIAGNOSIS — J80 Acute respiratory distress syndrome: Secondary | ICD-10-CM

## 2020-11-28 DIAGNOSIS — Z7189 Other specified counseling: Secondary | ICD-10-CM

## 2020-11-28 DIAGNOSIS — U071 COVID-19: Secondary | ICD-10-CM

## 2020-11-28 DIAGNOSIS — I469 Cardiac arrest, cause unspecified: Secondary | ICD-10-CM

## 2020-11-28 LAB — COMPREHENSIVE METABOLIC PANEL
ALT: 314 U/L — ABNORMAL HIGH (ref 0–44)
AST: 1695 U/L — ABNORMAL HIGH (ref 15–41)
Albumin: 1.9 g/dL — ABNORMAL LOW (ref 3.5–5.0)
Alkaline Phosphatase: 88 U/L (ref 38–126)
Anion gap: 13 (ref 5–15)
BUN: 44 mg/dL — ABNORMAL HIGH (ref 6–20)
CO2: 17 mmol/L — ABNORMAL LOW (ref 22–32)
Calcium: 7.7 mg/dL — ABNORMAL LOW (ref 8.9–10.3)
Chloride: 104 mmol/L (ref 98–111)
Creatinine, Ser: 1.75 mg/dL — ABNORMAL HIGH (ref 0.44–1.00)
GFR, Estimated: 33 mL/min — ABNORMAL LOW (ref >60)
Glucose, Bld: 246 mg/dL — ABNORMAL HIGH (ref 70–99)
Potassium: 4.4 mmol/L (ref 3.5–5.1)
Sodium: 134 mmol/L — ABNORMAL LOW (ref 135–145)
Total Bilirubin: 0.4 mg/dL (ref 0.3–1.2)
Total Protein: 4.7 g/dL — ABNORMAL LOW (ref 6.5–8.1)

## 2020-11-28 LAB — GLUCOSE, CAPILLARY
Glucose-Capillary: 259 mg/dL — ABNORMAL HIGH (ref 70–99)
Glucose-Capillary: 221 mg/dL — ABNORMAL HIGH (ref 70–99)
Glucose-Capillary: 196 mg/dL — ABNORMAL HIGH (ref 70–99)
Glucose-Capillary: 182 mg/dL — ABNORMAL HIGH (ref 70–99)
Glucose-Capillary: 163 mg/dL — ABNORMAL HIGH (ref 70–99)
Glucose-Capillary: 154 mg/dL — ABNORMAL HIGH (ref 70–99)
Glucose-Capillary: 152 mg/dL — ABNORMAL HIGH (ref 70–99)
Glucose-Capillary: 155 mg/dL — ABNORMAL HIGH (ref 70–99)
Glucose-Capillary: 157 mg/dL — ABNORMAL HIGH (ref 70–99)
Glucose-Capillary: 174 mg/dL — ABNORMAL HIGH (ref 70–99)
Glucose-Capillary: 190 mg/dL — ABNORMAL HIGH (ref 70–99)
Glucose-Capillary: 205 mg/dL — ABNORMAL HIGH (ref 70–99)
Glucose-Capillary: 216 mg/dL — ABNORMAL HIGH (ref 70–99)
Glucose-Capillary: 168 mg/dL — ABNORMAL HIGH (ref 70–99)
Glucose-Capillary: 202 mg/dL — ABNORMAL HIGH (ref 70–99)

## 2020-11-28 LAB — POCT I-STAT 7, (LYTES, BLD GAS, ICA,H+H)
Acid-base deficit: 7 mmol/L — ABNORMAL HIGH (ref 0.0–2.0)
Acid-base deficit: 5 mmol/L — ABNORMAL HIGH (ref 0.0–2.0)
Acid-base deficit: 7 mmol/L — ABNORMAL HIGH (ref 0.0–2.0)
Bicarbonate: 18.6 mmol/L — ABNORMAL LOW (ref 20.0–28.0)
Bicarbonate: 19.8 mmol/L — ABNORMAL LOW (ref 20.0–28.0)
Bicarbonate: 19.3 mmol/L — ABNORMAL LOW (ref 20.0–28.0)
Calcium, Ion: 1.18 mmol/L (ref 1.15–1.40)
Calcium, Ion: 1.16 mmol/L (ref 1.15–1.40)
Calcium, Ion: 1.13 mmol/L — ABNORMAL LOW (ref 1.15–1.40)
HCT: 30 % — ABNORMAL LOW (ref 36.0–46.0)
HCT: 29 % — ABNORMAL LOW (ref 36.0–46.0)
HCT: 29 % — ABNORMAL LOW (ref 36.0–46.0)
Hemoglobin: 10.2 g/dL — ABNORMAL LOW (ref 12.0–15.0)
Hemoglobin: 9.9 g/dL — ABNORMAL LOW (ref 12.0–15.0)
Hemoglobin: 9.9 g/dL — ABNORMAL LOW (ref 12.0–15.0)
O2 Saturation: 92 %
O2 Saturation: 98 %
O2 Saturation: 94 %
Patient temperature: 38.2
Patient temperature: 38.1
Potassium: 4.3 mmol/L (ref 3.5–5.1)
Potassium: 4.5 mmol/L (ref 3.5–5.1)
Potassium: 4.3 mmol/L (ref 3.5–5.1)
Sodium: 135 mmol/L (ref 135–145)
Sodium: 136 mmol/L (ref 135–145)
Sodium: 137 mmol/L (ref 135–145)
TCO2: 20 mmol/L — ABNORMAL LOW (ref 22–32)
TCO2: 21 mmol/L — ABNORMAL LOW (ref 22–32)
TCO2: 21 mmol/L — ABNORMAL LOW (ref 22–32)
pCO2 arterial: 39.6 mmHg (ref 32.0–48.0)
pCO2 arterial: 37.3 mmHg (ref 32.0–48.0)
pCO2 arterial: 40 mmHg (ref 32.0–48.0)
pH, Arterial: 7.285 — ABNORMAL LOW (ref 7.350–7.450)
pH, Arterial: 7.338 — ABNORMAL LOW (ref 7.350–7.450)
pH, Arterial: 7.293 — ABNORMAL LOW (ref 7.350–7.450)
pO2, Arterial: 75 mmHg — ABNORMAL LOW (ref 83.0–108.0)
pO2, Arterial: 115 mmHg — ABNORMAL HIGH (ref 83.0–108.0)
pO2, Arterial: 80 mmHg — ABNORMAL LOW (ref 83.0–108.0)

## 2020-11-28 LAB — CBC
HCT: 30.2 % — ABNORMAL LOW (ref 36.0–46.0)
Hemoglobin: 10.3 g/dL — ABNORMAL LOW (ref 12.0–15.0)
MCH: 31.8 pg (ref 26.0–34.0)
MCHC: 34.1 g/dL (ref 30.0–36.0)
MCV: 93.2 fL (ref 80.0–100.0)
Platelets: 229 10*3/uL (ref 150–400)
RBC: 3.24 MIL/uL — ABNORMAL LOW (ref 3.87–5.11)
RDW: 14.5 % (ref 11.5–15.5)
WBC: 2.2 10*3/uL — ABNORMAL LOW (ref 4.0–10.5)
nRBC: 1.8 % — ABNORMAL HIGH (ref 0.0–0.2)

## 2020-11-28 LAB — LACTIC ACID, PLASMA
Lactic Acid, Venous: 5.2 mmol/L (ref 0.5–1.9)
Lactic Acid, Venous: 1.8 mmol/L (ref 0.5–1.9)
Lactic Acid, Venous: 1.8 mmol/L (ref 0.5–1.9)

## 2020-11-28 LAB — PHOSPHORUS
Phosphorus: 4.2 mg/dL (ref 2.5–4.6)
Phosphorus: 4.2 mg/dL (ref 2.5–4.6)

## 2020-11-28 LAB — MAGNESIUM
Magnesium: 2.2 mg/dL (ref 1.7–2.4)
Magnesium: 2.2 mg/dL (ref 1.7–2.4)

## 2020-11-28 LAB — TROPONIN I (HIGH SENSITIVITY)
Troponin I (High Sensitivity): 606 ng/L (ref <18)
Troponin I (High Sensitivity): 2516 ng/L (ref <18)
Troponin I (High Sensitivity): 2674 ng/L (ref <18)

## 2020-11-28 LAB — HEPARIN LEVEL (UNFRACTIONATED)
Heparin Unfractionated: 0.32 IU/mL (ref 0.30–0.70)
Heparin Unfractionated: 0.66 IU/mL (ref 0.30–0.70)
Heparin Unfractionated: 0.94 IU/mL — ABNORMAL HIGH (ref 0.30–0.70)

## 2020-11-28 LAB — ECHOCARDIOGRAM LIMITED
Area-P 1/2: 3.85 cm2
S' Lateral: 3.3 cm

## 2020-11-28 LAB — HIV ANTIBODY (ROUTINE TESTING W REFLEX): HIV Screen 4th Generation wRfx: NONREACTIVE

## 2020-11-28 LAB — MRSA PCR SCREENING: MRSA by PCR: NEGATIVE

## 2020-11-28 MED ORDER — BARICITINIB 1 MG PO TABS
2.0000 mg | ORAL_TABLET | Freq: Every day | ORAL | Status: DC
  Administered 2020-11-28: 2 mg
  Filled 2020-11-28: qty 2

## 2020-11-28 MED ORDER — ASPIRIN 81 MG PO CHEW
81.0000 mg | CHEWABLE_TABLET | Freq: Every day | ORAL | Status: DC
  Administered 2020-11-28 – 2020-12-07 (×10): 81 mg
  Filled 2020-11-28 (×10): qty 1

## 2020-11-28 MED ORDER — POLYETHYLENE GLYCOL 3350 17 G PO PACK
17.0000 g | PACK | Freq: Every day | ORAL | Status: DC
  Administered 2020-11-28 – 2020-12-07 (×10): 17 g
  Filled 2020-11-28 (×9): qty 1

## 2020-11-28 MED ORDER — PROSOURCE TF PO LIQD
45.0000 mL | Freq: Three times a day (TID) | ORAL | Status: DC
  Administered 2020-11-28 – 2020-12-02 (×12): 45 mL
  Filled 2020-11-28 (×12): qty 45

## 2020-11-28 MED ORDER — FENTANYL CITRATE (PF) 100 MCG/2ML IJ SOLN: 50.0000 ug | Freq: Once | INTRAMUSCULAR | Status: AC

## 2020-11-28 MED ORDER — INSULIN DETEMIR 100 UNIT/ML ~~LOC~~ SOLN
5.0000 [IU] | Freq: Two times a day (BID) | SUBCUTANEOUS | Status: DC
  Administered 2020-11-28 – 2020-11-29 (×4): 5 [IU] via SUBCUTANEOUS
  Filled 2020-11-28 (×5): qty 0.05

## 2020-11-28 MED ORDER — INSULIN ASPART 100 UNIT/ML ~~LOC~~ SOLN
3.0000 [IU] | SUBCUTANEOUS | Status: DC
  Administered 2020-11-28 (×2): 9 [IU] via SUBCUTANEOUS
  Administered 2020-11-28: 6 [IU] via SUBCUTANEOUS
  Administered 2020-11-29: 9 [IU] via SUBCUTANEOUS
  Administered 2020-11-29: 6 [IU] via SUBCUTANEOUS
  Administered 2020-11-29: 3 [IU] via SUBCUTANEOUS
  Administered 2020-11-29: 6 [IU] via SUBCUTANEOUS
  Administered 2020-11-29: 9 [IU] via SUBCUTANEOUS

## 2020-11-28 MED ORDER — FENTANYL BOLUS VIA INFUSION
50.0000 ug | INTRAVENOUS | Status: DC | PRN
  Administered 2020-11-28 – 2020-12-07 (×7): 50 ug via INTRAVENOUS
  Filled 2020-11-28: qty 50

## 2020-11-28 MED ORDER — POLYETHYLENE GLYCOL 3350 17 G PO PACK: 17.0000 g | PACK | Freq: Every day | ORAL | Status: DC | PRN

## 2020-11-28 MED ORDER — FENTANYL 2500MCG IN NS 250ML (10MCG/ML) PREMIX INFUSION
50.0000 ug/h | INTRAVENOUS | Status: DC
  Administered 2020-11-28: 50 ug/h via INTRAVENOUS
  Administered 2020-11-29 – 2020-12-02 (×6): 175 ug/h via INTRAVENOUS
  Administered 2020-12-02 – 2020-12-03 (×2): 150 ug/h via INTRAVENOUS
  Administered 2020-12-03 – 2020-12-04 (×2): 200 ug/h via INTRAVENOUS
  Administered 2020-12-05: 100 ug/h via INTRAVENOUS
  Administered 2020-12-06: 125 ug/h via INTRAVENOUS
  Administered 2020-12-07: 200 ug/h via INTRAVENOUS
  Administered 2020-12-07: 125 ug/h via INTRAVENOUS
  Filled 2020-11-28 (×16): qty 250

## 2020-11-28 MED ORDER — ACETAMINOPHEN 325 MG PO TABS
650.0000 mg | ORAL_TABLET | Freq: Four times a day (QID) | ORAL | Status: DC | PRN
  Administered 2020-11-28: 650 mg
  Filled 2020-11-28: qty 2

## 2020-11-28 MED ORDER — CHLORHEXIDINE GLUCONATE CLOTH 2 % EX PADS
6.0000 | MEDICATED_PAD | Freq: Every day | CUTANEOUS | Status: DC
  Administered 2020-11-28 – 2020-12-07 (×10): 6 via TOPICAL

## 2020-11-28 MED ORDER — DOCUSATE SODIUM 50 MG/5ML PO LIQD
100.0000 mg | Freq: Two times a day (BID) | ORAL | Status: DC
  Administered 2020-11-28 – 2020-12-07 (×19): 100 mg
  Filled 2020-11-28 (×18): qty 10

## 2020-11-28 MED ORDER — VITAL AF 1.2 CAL PO LIQD
1000.0000 mL | ORAL | Status: DC
  Administered 2020-11-28 – 2020-12-01 (×2): 1000 mL
  Filled 2020-11-28 (×3): qty 1000

## 2020-11-28 MED FILL — Medication: Qty: 1 | Status: AC

## 2020-11-28 NOTE — Procedures (Signed)
History: 59 year old female being evaluated after cardiac arrest  Sedation: Propofol 40 mcg/KG/min  Technique: This is a 21 channel routine scalp EEG performed at the bedside with bipolar and monopolar montages arranged in accordance to the international 10/20 system of electrode placement. One channel was dedicated to EKG recording.    Background: The background consists of relatively attenuated background with  bursts of smoothly contoured delta and theta range activity.  There is no clinical correlate associated with these bursts, and the periods of attenuation are relatively brief, 1 to 4 seconds.  Photic stimulation: Physiologic driving is now performed  EEG Abnormalities: 1) burst suppression  Clinical Interpretation: This EEG is consistent with a severe cerebral dysfunction as can be seen with hypoxic brain injury or medically induced coma.  Given the presence of propofol sedation, lack of clinical correlate with the burst, and benign appearance of the burst, I do not know that this EEG carries the dismal prognostic significance that a burst suppression pattern can in this context, however it certainly does not exclude severe anoxic injury either.   There was no seizure or seizure predisposition recorded on this study. Please note that lack of epileptiform activity on EEG does not preclude the possibility of epilepsy.   Roland Rack, MD [facility] [phone]  If 7pm- 7am, please page neurology on call as listed in [facility].

## 2020-11-28 NOTE — Progress Notes (Signed)
EEG complete - results pending 

## 2020-11-28 NOTE — ED Notes (Signed)
Date and time results received: 11/28/20 2123 (use smartphrase ".now" to insert current time)  Test: troponin Critical Value: 380  Name of Provider Notified: Trevor Mace, MD  Orders Received? Or Actions Taken?: timed repeat troponin

## 2020-11-28 NOTE — Consult Note (Signed)
Consultation Note Date: 11/28/2020   Patient Name: Abigail [name]  DOB: 1961/12/08  MRN: [id number]  Age / Sex: 59 y.o., female  PCP: Lois Huxley, [place] Referring Physician: Julian Hy, DO  Reason for Consultation: Establishing goals of care and Psychosocial/spiritual support  HPI/Patient Profile: 59 y.o. female  with past medical history of bipolar disorder, CAD with NSTEMI 2017 sp angioplasty, DM, HLD, uterine cancer.  She was not feeling well for 2 weeks, husband Covid positive, but she wasn't tested.  Daughters worried and went to her home, called EMS concerned for her breathing. sats in the 70's with 15L [place]ter and non re breather to maintain sats in 90's.  She was in lactic acidosis and had PEA arrest in ED/intubated.  She was admitted on 12/10/2020 with cardiac arrest PEA/asystole, ARDS hypoxic respiratory failure 2/2 Covid PNE. [name]   Clinical Assessment and Goals of Care: I have reviewed medical records including EPIC notes, labs and imaging, received report from CCM attending and bedside nursing staff, examined the patient.  [name] appears acutely/chronically ill, pale, frail.  She is intubated/ventilated and sedated.  There is no family at bedside at this time due to Covid visitor restrictions.    Call to spouse, Geniva Lohnes to discuss diagnosis prognosis, GOC, EOL wishes, disposition and options.  I introduced Palliative Medicine as specialized medical care for people living with serious illness. It focuses on providing relief from the symptoms and stress of a serious illness.   We discussed a brief life review of the patient.  Mr. Cappelletti tells me that he and [name] have been married for 30 years.  They have 2 daughters, Verdis Frederickson and Gillespie.  [name] is not currently working, she will work a job for a few months at a time, but has been trying for disability due to her diabetes.  As far  as functional and nutritional status, [name] is independent with IADLs/ADLs.  Per chart review, daughter Verdis Frederickson shares that [name] is somewhat frail, and could not walk a mile.  We discussed her current illness and what it means in the larger context of her on-going co-morbidities.  Natural disease trajectory and expectations at EOL were discussed.  I share that [name] is critically ill, on life support.  Mr. Zentz states that, "she died 3 times in the house".  Advanced directives, concepts specific to code status were considered and discussed.  We talked about the treatment plan, what we are doing to support [name].  I share my concern that if we are doing all these things, and still she worsens, she may suffer with further chest compressions.  I share the concept of "treat the treatable, but allowing natural passing".  I encourage Mr. Axelson to consider these choices.  At this point he tells me, "I do not want to lose her".  Mr. Trine denies questions at this time.  He requests that I update their daughter, Verdis Frederickson.    Call to daughter, Stark Klein.  We talked at length about the treatment  plan including, but not limited to, medications, fluids, nutrition, labs.  We talked in detail about ventilator support, ventilation/oxygenation, 100% FiO2.  Verdis Frederickson and I also talked about CODE STATUS.  I share my concern that if we are doing all these things for [name] and still she worsens, her heart stops, attempting resuscitation may only cause suffering.  We talked about time for outcomes.  PMT to reach out to Verdis Frederickson and Mr. Tesch tomorrow.  Conference with CCM, bedside nursing staff related to patient condition, needs, goals of care.   HCPOA   NEXT OF KIN -husband of 93 years, Elier Weingarten.  They have 2 adult daughters, Verdis Frederickson and Wilhemena Durie who are involved in decision-making.    SUMMARY OF RECOMMENDATIONS   Full scope/full code Time for outcomes Continue to treat the  treatable   Code Status/Advance Care Planning:  Full code   Symptom Management:   Per CCM, no additional needs at this time.   Palliative Prophylaxis:   Oral Care and Turn Reposition  Additional Recommendations (Limitations, Scope, Preferences):  Full Scope Treatment  Psycho-social/Spiritual:   Desire for further Chaplaincy support:no  Additional Recommendations: Caregiving  Support/Resources and ICU Family Guide  Prognosis:   Unable to determine, based on outcomes.  Critically ill, prognosis guarded at this time.  Discharge Planning: To be determined, based on outcomes.       Primary Diagnoses: Present on Admission: . Cardiac arrest ([place])   I have reviewed the medical record, interviewed the patient and family, and examined the patient. The following aspects are pertinent.  Past Medical History:  Diagnosis Date  . Bipolar disorder ([place])   . CAD S/P percutaneous coronary angioplasty 04/06/2016   2 Vessel CAD: Ost-prox-mid RCA (80%, 80% & 99%) -> Synergy DES 2.5 x 38, mid-dist RCA -> Synergy DES 2.25 x 20.  mid LAD 80% -> Synergy DES 3.0 x 12 (3.3 mm), dist LAD 85%, D1 occluded-> Synergy DES 3.0 x 12 (3.3 mm)   . Cancer ([place])    uterus  . Diabetes mellitus without complication ([place])   . Hypertriglyceridemia   . Non-STEMI (non-ST elevated myocardial infarction) ([place]) 03/2016   Found to have severe 2 vessel disease involving the ostial to mid RCA and then mid to distal RCA as well as mid and distal LAD. - 4 site PCI   Social History   Socioeconomic History  . Marital status: Married    Spouse name: Not on file  . Number of children: Not on file  . Years of education: Not on file  . Highest education level: Not on file  Occupational History  . Not on file  Tobacco Use  . Smoking status: Former Smoker    Quit date: 04/25/2016    Years since quitting: 4.5  . Smokeless tobacco: Never Used  . Tobacco comment: quit smoking in Apr 2017  Substance and Sexual  Activity  . Alcohol use: No    Alcohol/week: 0.0 standard drinks  . Drug use: No  . Sexual activity: Not on file  Other Topics Concern  . Not on file  Social History Narrative  . Not on file   Social Determinants of Health   Financial Resource Strain: Not on file  Food Insecurity: Not on file  Transportation Needs: Not on file  Physical Activity: Not on file  Stress: Not on file  Social Connections: Not on file   Family History  Problem Relation Age of Onset  . CAD Mother  s/p CABG  . Heart disease Father   . Prostate cancer Father   . Heart attack Maternal Grandfather   . Heart attack Paternal Grandfather    Scheduled Meds: . aspirin  81 mg Per Tube Daily  . baricitinib  2 mg Per Tube Daily  . chlorhexidine gluconate (MEDLINE KIT)  15 mL Mouth Rinse BID  . Chlorhexidine Gluconate Cloth  6 each Topical Daily  . docusate  100 mg Per Tube BID  . mouth rinse  15 mL Mouth Rinse 10 times per day  . methylPREDNISolone (SOLU-MEDROL) injection  1 mg/kg Intravenous Q12H   Followed by  . [START ON 11/30/2020] predniSONE  50 mg Oral Daily  . pantoprazole (PROTONIX) IV  40 mg Intravenous QHS  . polyethylene glycol  17 g Per Tube Daily   Continuous Infusions: . sodium chloride    . azithromycin Stopped (12/15/2020 2030)  . cefTRIAXone (ROCEPHIN)  IV Stopped (11/18/2020 1948)  . dextrose 5% lactated ringers Stopped (12/14/2020 2217)  . fentaNYL infusion INTRAVENOUS 50 mcg/hr (11/28/20 0920)  . heparin 1,000 Units/hr (11/28/20 0900)  . insulin 1.4 mL/hr at 11/28/20 0900  . lactated ringers 125 mL/hr at 11/28/20 0900  . norepinephrine (LEVOPHED) Adult infusion 5 mcg/min (11/28/20 0900)  . propofol (DIPRIVAN) infusion 45 mcg/kg/min (11/28/20 0900)  . remdesivir 100 mg in NS 100 mL 100 mg (11/28/20 0923)   PRN Meds:.acetaminophen, dextrose, fentaNYL, fentaNYL (SUBLIMAZE) injection, polyethylene glycol Medications Prior to Admission:  Prior to Admission medications   Medication  Sig Start Date End Date Taking? Authorizing Provider  atorvastatin (LIPITOR) 80 MG tablet Take 1 tablet (80 mg total) by mouth daily at 6 PM. 04/16/16   Theodis Blaze, MD  blood glucose meter kit and supplies KIT Dispense based on patient and insurance preference. Use up to four times daily as directed. (FOR ICD-9 250.00, 250.01). 04/16/16   Theodis Blaze, MD  BRILINTA 60 MG TABS tablet TAKE 1 TABLET BY MOUTH TWICE DAILY **PT  NEEDS  APPOINTMENT** Patient taking differently: Take 60 mg by mouth 2 (two) times daily. 05/15/19   Leonie Man, MD  BRILINTA 90 MG TABS tablet TAKE 1 TABLET BY MOUTH TWICE DAILY Patient taking differently: Take 90 mg by mouth 2 (two) times daily. 05/14/18   Leonie Man, MD  docusate sodium (STOOL SOFTENER) 100 MG capsule Take 100 mg by mouth 2 (two) times daily.    [provider]  empagliflozin (JARDIANCE) 25 MG TABS tablet Take 25 mg by mouth daily.    [provider]  fenofibrate 160 MG tablet Take 1 tablet (160 mg total) by mouth daily. 04/16/16   Theodis Blaze, MD  ferrous sulfate 324 (65 Fe) MG TBEC Take by mouth.    [provider]  insulin glargine (LANTUS) 100 UNIT/ML injection Inject 0.1 mLs (10 Units total) into the skin at bedtime. Patient taking differently: Inject 14-17 Units into the skin at bedtime.  04/16/16   Theodis Blaze, MD  levothyroxine (SYNTHROID) 88 MCG tablet Take 88 mcg by mouth daily. 09/15/20   [provider]  losartan (COZAAR) 50 MG tablet Take 50 mg by mouth daily. 09/15/20   [provider]  metFORMIN (GLUCOPHAGE) 1000 MG tablet Take 1,000 mg by mouth 2 (two) times daily. 09/15/20   [provider]  rosuvastatin (CRESTOR) 20 MG tablet Take 20 mg by mouth at bedtime. 10/31/20   [provider]   No Known Allergies Review of Systems  Unable to  perform ROS: Intubated    Physical Exam Vitals and nursing note reviewed.  Constitutional:      General: She is not in acute  distress.    Appearance: She is normal weight. She is ill-appearing.     Interventions: She is intubated.  HENT:     Head: Normocephalic and atraumatic.  Cardiovascular:     Rate and Rhythm: Regular rhythm.  Pulmonary:     Effort: She is intubated.  Neurological:     Comments: Intubated/ventilated/sedated     Vital Signs: BP (!) 106/59   Pulse 72   Temp 100.22 F (37.9 C)   Resp (!) 31   Ht 5' 2"  (1.575 m)   Wt 62 kg   SpO2 91%   BMI 25.00 kg/m  Pain Scale: CPOT   Pain Score: 0-No pain   SpO2: SpO2: 91 % O2 Device:SpO2: 91 % O2 Flow Rate: .O2 Flow Rate (L/min): 15 L/min  IO: Intake/output summary:   Intake/Output Summary (Last 24 hours) at 11/28/2020 1013 Last data filed at 11/28/2020 0900 Gross per 24 hour  Intake 4856.49 ml  Output 150 ml  Net 4706.49 ml    LBM: Last BM Date:  (PTA) Baseline Weight: Weight: 59.9 kg Most recent weight: Weight: 62 kg     Palliative Assessment/Data:   Flowsheet Rows   Flowsheet Row Most Recent Value  Intake Tab   Referral Department Hospitalist  Unit at Time of Referral ICU  Palliative Care Primary Diagnosis Cardiac  Date Notified 12/01/2020  Palliative Care Type New Palliative care  Reason for referral Clarify Goals of Care  Date of Admission 12/01/2020  Date first seen by Palliative Care 11/28/20  # of days Palliative referral response time 1 Day(s)  # of days IP prior to Palliative referral 0  Clinical Assessment   Palliative Performance Scale Score 10%  Psychosocial & Spiritual Assessment   Palliative Care Outcomes       Time In: 0940 Time Out: 1050 Time Total: 70 minutes  Greater than 50%  of this time was spent counseling and coordinating care related to the above assessment and plan.  Signed by: Drue Novel, NP   Please contact Palliative Medicine Team phone at [phone] for questions and concerns.  For individual provider: See Shea Evans

## 2020-11-28 NOTE — Progress Notes (Signed)
ANTICOAGULATION CONSULT NOTE   Pharmacy Consult for IV heparin Indication: r/o ACS  No Known Allergies  Patient Measurements: Height: 5\' 2"  (157.5 cm) Weight: 62 kg (136 lb 11 oz) IBW/kg (Calculated) : 50.1 Heparin Dosing Weight: 59.9 kg  Vital Signs: Temp: 95.36 F (35.2 C) (12/12 1700) Temp Source: Bladder (12/12 1600) BP: 100/59 (12/12 1700) Pulse Rate: 59 (12/12 1700)  Labs: Recent Labs    12/08/2020 1527 11/30/2020 1936 11/28/2020 1936 11/19/2020 2255 11/28/20 0305 11/28/20 0309 11/28/20 0428 11/28/20 0801 11/28/20 0819 11/28/20 1027 11/28/20 1028 11/28/20 1317 11/28/20 1848  HGB 12.0  --   --   --   --    < > 10.3* 9.9*  --   --   --  9.9*  --   HCT 36.7  --   --   --   --    < > 30.2* 29.0*  --   --   --  29.0*  --   PLT 329  --   --   --   --   --  229  --   --   --   --   --   --   HEPARINUNFRC  --   --   --   --  0.32  --   --   --   --   --  0.66  --  0.94*  CREATININE 1.36* 1.43*  --   --   --   --  1.75*  --   --   --   --   --   --   TROPONINIHS  --  380*   < > 606*  --   --   --   --  2,516* 2,674*  --   --   --    < > = values in this interval not displayed.    Estimated Creatinine Clearance: 30.4 mL/min (A) (by C-G formula based on SCr of 1.75 mg/dL (H)).   Assessment: 59 yo female with likely COVID PNA (husband with COVID), acute hypoxic respiratory failure to start IV heparin for r/o PE. CT negative for PT. Pt also with elevated troponin so continuing heparin for r/o ACS.  Heparin level 0.94 (supratherapeutic) on gtt at 950 units/hr.   Goal of Therapy:  Heparin level 0.3-0.7 units/ml Monitor platelets by anticoagulation protocol: Yes   Plan:  -Decrease IV heparin to 800 units/hr -heparin level and CBC in am  Hildred Laser, PharmD Clinical Pharmacist **Pharmacist phone directory can now be found on [email or web address] ([email or web address]).  Listed under [facility].

## 2020-11-28 NOTE — Progress Notes (Signed)
ANTICOAGULATION CONSULT NOTE   Pharmacy Consult for IV heparin Indication: r/o ACS  No Known Allergies  Patient Measurements: Height: 5\' 2"  (157.5 cm) Weight: 62 kg (136 lb 11 oz) IBW/kg (Calculated) : 50.1 Heparin Dosing Weight: 59.9 kg  Vital Signs: Temp: 101.12 F (38.4 C) (12/12 0400) Temp Source: Core (12/12 0400) BP: 134/21 (12/12 0400) Pulse Rate: 84 (12/12 0316)  Labs: Recent Labs    12/06/2020 1527 12/11/2020 1936 11/24/2020 2255 11/28/20 0305 11/28/20 0309  HGB 12.0  --   --   --  10.2*  HCT 36.7  --   --   --  30.0*  PLT 329  --   --   --   --   HEPARINUNFRC  --   --   --  0.32  --   CREATININE 1.36* 1.43*  --   --   --   TROPONINIHS  --  380* 606*  --   --     Estimated Creatinine Clearance: 37.2 mL/min (A) (by C-G formula based on SCr of 1.43 mg/dL (H)).   Assessment: 59 yo female with likely COVID PNA (husband with COVID), acute hypoxic respiratory failure to start IV heparin for r/o PE. CT negative for PT. Pt also with elevated troponin so continuing heparin for r/o ACS.  Heparin level 0.32 (therapeutic) on gtt at 1000 units/hr. No bleeding noted. Hgb down to 10.2.   Goal of Therapy:  Heparin level 0.3-0.7 units/ml Monitor platelets by anticoagulation protocol: Yes   Plan:   Continue IV heparin at 1000 units/hr  F/u 6 hr confirmatory heparin level  Sherlon Handing, PharmD, BCPS Please see amion for complete clinical pharmacist phone list 11/28/2020,4:08 AM

## 2020-11-28 NOTE — Progress Notes (Signed)
ANTICOAGULATION CONSULT NOTE   Pharmacy Consult for IV heparin Indication: r/o ACS  No Known Allergies  Patient Measurements: Height: 5\' 2"  (157.5 cm) Weight: 62 kg (136 lb 11 oz) IBW/kg (Calculated) : 50.1 Heparin Dosing Weight: 59.9 kg  Vital Signs: Temp: 100.22 F (37.9 C) (12/12 0900) Temp Source: Core (12/12 0400) BP: 106/59 (12/12 0900) Pulse Rate: 72 (12/12 0900)  Labs: Recent Labs    11/19/2020 1527 11/19/2020 1936 12/17/2020 2255 11/28/20 0305 11/28/20 0309 11/28/20 0428 11/28/20 0801 11/28/20 0819 11/28/20 1028  HGB 12.0  --   --   --  10.2* 10.3* 9.9*  --   --   HCT 36.7  --   --   --  30.0* 30.2* 29.0*  --   --   PLT 329  --   --   --   --  229  --   --   --   HEPARINUNFRC  --   --   --  0.32  --   --   --   --  0.66  CREATININE 1.36* 1.43*  --   --   --  1.75*  --   --   --   TROPONINIHS  --  380* 606*  --   --   --   --  2,516*  --     Estimated Creatinine Clearance: 30.4 mL/min (A) (by C-G formula based on SCr of 1.75 mg/dL (H)).   Assessment: 59 yo female with likely COVID PNA (husband with COVID), acute hypoxic respiratory failure to start IV heparin for r/o PE. CT negative for PT. Pt also with elevated troponin so continuing heparin for r/o ACS.  Heparin level 0.66 (therapeutic) on gtt at 1000 units/hr. No bleeding noted. Hgb down to 9.9.   Goal of Therapy:  Heparin level 0.3-0.7 units/ml Monitor platelets by anticoagulation protocol: Yes   Plan:   Decrease IV heparin to 950 units/hr  F/u 6 hr confirmatory heparin level  Alanda Slim, PharmD, [facility] Clinical Pharmacist Please see AMION for all Pharmacists' Contact Phone Numbers 11/28/2020, 11:09 AM

## 2020-11-28 NOTE — Progress Notes (Signed)
ABG collected and results called in to [facility].  

## 2020-11-28 NOTE — Progress Notes (Addendum)
NAME:  Abigail [name], MRN:  [id number], DOB:  [birth year], LOS: 1 ADMISSION DATE:  11/18/2020, CONSULTATION DATE:  11/19/2020 REFERRING MD:  Messick-ED, CHIEF COMPLAINT:  Hypoxic respiratory failure  Brief History   Husband with covid, SOB and feeling poorly x 2 weeks  History of present illness   Abigail [name] is a 59 year old woman who has been feeling ill for about 2 weeks.  Her husband has Covid he has not yet been tested.  Her daughters are worried about her and let by the house today to check on her.  They were concerned about her breathing and called EMS to transport her to the hospital.  Upon arrival she had a lactic acidosis, saturations in the 70s requiring 15 L salter and nonrebreather mask to maintain saturations in the 90s.  Unable to obtain history the patient due to acuity of condition.  Had a PEA cardiac arrest in the emergency department, intubated during the code.  Per her daughter Abigail [name] she is very frail at baseline, but is independent in her ADLs.  She is able to walk around shopping, but would not be able to walk a mile.  Past Medical History  DM Cancer Bipolar HLD NSTEMI  Significant Hospital Events   Cardiac arrest - PEA 12/11 12/11 Transferred to [place]:    Procedures:  ETT 12/11 Aline 12/11  Significant Diagnostic Tests:  CTA 12/11 - No PE, bilateral dense GGO with dense consolidation in the lower lobes bilaterally  Micro Data:  Covid pending 12/11 blood cultures 12/11 trach aspirate  Antimicrobials:  Ceftriaxone 12/11> Azithromycin 12/11>  Interim history/subjective:  Transferred from [facility] to [facility], intubated. On FIO2 100%, PEEP16 this morning. Overbreathing the vent.  Objective   Blood pressure (!) 106/59, pulse 72, temperature 100.22 F (37.9 C), resp. rate (!) 31, height 5\' 2"  (1.575 m), weight 62 kg, SpO2 91 %.    Vent Mode: PRVC FiO2 (%):  [100 %] 100 % Set Rate:  [28 bmp] 28 bmp Vt Set:  [400 mL] 400 mL PEEP:  [14  cmH20-18 cmH20] 16 cmH20 Plateau Pressure:  [27 cmH20-39 cmH20] 39 cmH20   Intake/Output Summary (Last 24 hours) at 11/28/2020 0944 Last data filed at 11/28/2020 9379 Gross per 24 hour  Intake 4365.95 ml  Output 150 ml  Net 4215.95 ml   Filed Weights   12/12/2020 1518 11/28/20 0333  Weight: 59.9 kg 62 kg   Physical Exam: General: Critically ill-appearing, overbreathing the vent HENT: [place], AT, ETT in place Eyes: EOMI, no scleral icterus Respiratory: Coarse breath sounds bilaterally.  No crackles, wheezing or rales Cardiovascular: RRR, -M/R/G, no JVD GI: BS+, soft, nontender Extremities:-Edema,-tenderness Neuro: Sedated, no withdrawal to pain  CTA 12/11 - No PE, bilateral dense GGO with dense consolidation in the lower lobes bilaterally Resolved Hospital Problem list     Assessment & Plan:  Cardiac arrest- PEA/ asystole Likely secondary to hypoxemia. CTA neg for PE. EKG with inferior T-wave changes that have resolved. -Telemetry -Trend trop [phone] -Heparin gtt -Echo ordered  Acute toxic-metabolic encephalopathy in setting of critical illness, cardiac arrest -EEG  -CT head when stable -Consult Neuro  ARDS/Acute hypoxic respiratory failure requiring secondary to COVID pneumonia --Lung protective strategy: 8cc/kg. Reviewed ABG. PEEP reduced to 16 --Repeat ABG   --Continue CAP coverage day 2/5 --Discontinue Remdesivir due to elevated LFTs --Start steroids followed by taper --Dose reduced barcitinib for renal dysfunction --VAP prevention protocol --Goal euvolemia -PAD protocol: Fentanyl and Propofol for RASS goal -4  Septic  shock secondary to COVID pneumonia, some component sedation-induced hypotension --Continue levophed for MAP >65 --LA cleared --Continue antibiotics --F/u cultures  Hyperglycemia secondary uncontrolled diabetes, critical illness --A1c 10.7 --On IVF per endotool --Start tube feeds -Transition from insulin gtt to Lantus this afternoon  AKI,  likely prerenal --Monitor UOP/Cr --Avoid nephrotoxic agents, renally dose medication  Elevated transaminases, likely due to sepsis -Continue to monitor  Best practice (evaluated daily)   Diet: NPO, start TF Pain/Anxiety/Delirium protocol (if indicated): ordered VAP protocol (if indicated): yes DVT prophylaxis: heparin [place] GI prophylaxis: pantoprazole Glucose control: insulin gtt Mobility: bedrest last date of multidisciplinary goals of care discussion: 12/11 Family and staff present  Summary of discussion: Daughter Abigail [name] called and updated.  She confirms that family would want full resuscitation measures at this time.  We discussed the poor prognosis of Covid and severe respiratory failure requiring mechanical ventilation steroids with anoxic sequela of cardiac arrest.  We will continue to have ongoing goals of care discussions forthcoming. Follow up goals of care discussion due - Palliative team following and planning for discussion on 12/12 Code Status: full Disposition: ICU  Labs   CBC: Recent Labs  Lab 11/25/2020 1527 11/28/20 0309 11/28/20 0428 11/28/20 0801  WBC 6.2  --  2.2*  --   NEUTROABS 5.1  --   --   --   HGB 12.0 10.2* 10.3* 9.9*  HCT 36.7 30.0* 30.2* 29.0*  MCV 95.3  --  93.2  --   PLT 329  --  229  --     Basic Metabolic Panel: Recent Labs  Lab 11/17/2020 1527 12/14/2020 1936 11/28/20 0309 11/28/20 0428 11/28/20 0801  NA 129* 136 135 134* 136  K 5.3* 4.5 4.3 4.4 4.5  CL 94* 104  --  104  --   CO2 17* 9*  --  17*  --   GLUCOSE 385* 495*  --  246*  --   BUN 44* 44*  --  44*  --   CREATININE 1.36* 1.43*  --  1.75*  --   CALCIUM 8.2* 7.3*  --  7.7*  --    GFR: Estimated Creatinine Clearance: 30.4 mL/min (A) (by C-G formula based on SCr of 1.75 mg/dL (H)). Recent Labs  Lab 12/01/2020 1527 11/23/2020 1935 11/23/2020 2255 11/28/20 0428 11/28/20 0820  PROCALCITON 0.52  --   --   --   --   WBC 6.2  --   --  2.2*  --   LATICACIDVEN 7.5* 8.3* 5.2*  --  1.8     Liver Function Tests: Recent Labs  Lab 11/17/2020 1527 11/28/20 0428  AST 73* 1,695*  ALT 25 314*  ALKPHOS 89 88  BILITOT 0.5 0.4  PROT 7.1 4.7*  ALBUMIN 2.9* 1.9*   No results for input(s): LIPASE, AMYLASE in the last 168 hours. No results for input(s): AMMONIA in the last 168 hours.  ABG    Component Value Date/Time   PHART 7.338 (L) 11/28/2020 0801   PCO2ART 37.3 11/28/2020 0801   PO2ART 115 (H) 11/28/2020 0801   HCO3 19.8 (L) 11/28/2020 0801   TCO2 21 (L) 11/28/2020 0801   ACIDBASEDEF 5.0 (H) 11/28/2020 0801   O2SAT 98.0 11/28/2020 0801     Coagulation Profile: No results for input(s): INR, PROTIME in the last 168 hours.  Cardiac Enzymes: No results for input(s): CKTOTAL, CKMB, CKMBINDEX, TROPONINI in the last 168 hours.  HbA1C: Hgb A1c MFr Bld  Date/Time Value Ref Range Status  12/04/2020 07:55 PM 10.7 (  H) 4.8 - 5.6 % Final    Comment:    (NOTE) Pre diabetes:          5.7%-6.4%  Diabetes:              >6.4%  Glycemic control for   <7.0% adults with diabetes   04/13/2016 12:55 AM 13.8 (H) 4.8 - 5.6 % Final    Comment:    (NOTE)         Pre-diabetes: 5.7 - 6.4         Diabetes: >6.4         Glycemic control for adults with diabetes: <7.0     CBG: Recent Labs  Lab 11/28/20 0459 11/28/20 0606 11/28/20 0648 11/28/20 0755 11/28/20 0913  GLUCAP 182* 163* 154* 152* 155*    Critical care time:   The patient is critically ill with multiple organ systems failure and requires high complexity decision making for assessment and support, frequent evaluation and titration of therapies, application of advanced monitoring technologies and extensive interpretation of multiple databases.  Independent Critical Care Time: 60 Minutes.   Rodman Pickle, M.D. [facility] Pulmonary/Critical Care Medicine 11/28/2020 9:45 AM   Please see Amion for pager number to reach on-call Pulmonary and Critical Care Team.

## 2020-11-28 NOTE — Progress Notes (Signed)
  Echocardiogram 2D Echocardiogram has been performed.  Abigail [name] 11/28/2020, 12:33 PM

## 2020-11-28 NOTE — Progress Notes (Signed)
Initial Nutrition Assessment  DOCUMENTATION CODES:   Not applicable  INTERVENTION:   Initiate tube feeding via OG tube: Vital AF 1.2 at 35 ml/h (840 ml per day) Prosource TF 45 ml TID  Provides 1128 kcal (1458 kcal total with propofol), 96 gm protein, 681 ml free water daily.  NUTRITION DIAGNOSIS:   Increased nutrient needs related to acute illness (COVID) as evidenced by estimated needs.  GOAL:   Patient will meet greater than or equal to 90% of their needs  MONITOR:   Vent status,TF tolerance,Labs  REASON FOR ASSESSMENT:   Consult Enteral/tube feeding initiation and management  ASSESSMENT:   59 yo female admitted with COVID symptoms, PEA arrest in ED requiring intubation. Tested positive for COVID. PMH includes DM, bipolar disorder, HLD, NSTEMI, CAD.   Per review of progress notes, patient has been feeling ill for about 2 weeks. Suspect intake has been less than optimal during this time.  Received MD Consult for TF initiation and management. OG tube in place.  Patient is currently intubated on ventilator support MV: 12.3 L/min Temp (24hrs), Avg:99.8 F (37.7 C), Min:97 F (36.1 C), Max:101.3 F (38.5 C)  Propofol: 12.5 ml/hr providing 330 kcal from lipid.  Labs reviewed. BUN 44, creat 1.75 CBG: [phone]  Medications reviewed and include baricitinib, colace, solumedrol, miralax, IV insulin drip, levophed, propofol.  No recent weights available PTA for review.   Diet Order:   Diet Order            Diet NPO time specified  Diet effective now                 EDUCATION NEEDS:   Not appropriate for education at this time  Skin:  Skin Assessment: Reviewed RN Assessment  Last BM:  no BM documented  Height:   Ht Readings from Last 1 Encounters:  11/28/20 5\' 2"  (1.575 m)    Weight:   Wt Readings from Last 1 Encounters:  11/28/20 62 kg    Ideal Body Weight:  50 kg  BMI:  Body mass index is 25 kg/m.  Estimated Nutritional  Needs:   Kcal:  5997-7414  Protein:  95-115 gm  Fluid:  >/= 1.8 L    Lucas Mallow, RD, LDN, CNSC Please refer to Amion for contact information.

## 2020-11-29 ENCOUNTER — Inpatient Hospital Stay (HOSPITAL_COMMUNITY): Payer: BLUE CROSS/BLUE SHIELD

## 2020-11-29 LAB — CBC
HCT: 31.3 % — ABNORMAL LOW (ref 36.0–46.0)
Hemoglobin: 10.3 g/dL — ABNORMAL LOW (ref 12.0–15.0)
MCH: 31 pg (ref 26.0–34.0)
MCHC: 32.9 g/dL (ref 30.0–36.0)
MCV: 94.3 fL (ref 80.0–100.0)
Platelets: 266 10*3/uL (ref 150–400)
RBC: 3.32 MIL/uL — ABNORMAL LOW (ref 3.87–5.11)
RDW: 14.9 % (ref 11.5–15.5)
WBC: 5.7 10*3/uL (ref 4.0–10.5)
nRBC: 1.1 % — ABNORMAL HIGH (ref 0.0–0.2)

## 2020-11-29 LAB — COMPREHENSIVE METABOLIC PANEL
ALT: 189 U/L — ABNORMAL HIGH (ref 0–44)
AST: 307 U/L — ABNORMAL HIGH (ref 15–41)
Albumin: 1.7 g/dL — ABNORMAL LOW (ref 3.5–5.0)
Alkaline Phosphatase: 89 U/L (ref 38–126)
Anion gap: 14 (ref 5–15)
BUN: 59 mg/dL — ABNORMAL HIGH (ref 6–20)
CO2: 17 mmol/L — ABNORMAL LOW (ref 22–32)
Calcium: 7.3 mg/dL — ABNORMAL LOW (ref 8.9–10.3)
Chloride: 109 mmol/L (ref 98–111)
Creatinine, Ser: 2.63 mg/dL — ABNORMAL HIGH (ref 0.44–1.00)
GFR, Estimated: 20 mL/min — ABNORMAL LOW (ref >60)
Glucose, Bld: 159 mg/dL — ABNORMAL HIGH (ref 70–99)
Potassium: 4.9 mmol/L (ref 3.5–5.1)
Sodium: 140 mmol/L (ref 135–145)
Total Bilirubin: 0.6 mg/dL (ref 0.3–1.2)
Total Protein: 4.6 g/dL — ABNORMAL LOW (ref 6.5–8.1)

## 2020-11-29 LAB — GLUCOSE, CAPILLARY
Glucose-Capillary: 138 mg/dL — ABNORMAL HIGH (ref 70–99)
Glucose-Capillary: 164 mg/dL — ABNORMAL HIGH (ref 70–99)
Glucose-Capillary: 171 mg/dL — ABNORMAL HIGH (ref 70–99)
Glucose-Capillary: 211 mg/dL — ABNORMAL HIGH (ref 70–99)
Glucose-Capillary: 255 mg/dL — ABNORMAL HIGH (ref 70–99)
Glucose-Capillary: 311 mg/dL — ABNORMAL HIGH (ref 70–99)

## 2020-11-29 LAB — PHOSPHORUS: Phosphorus: 6.1 mg/dL — ABNORMAL HIGH (ref 2.5–4.6)

## 2020-11-29 LAB — HEPARIN LEVEL (UNFRACTIONATED): Heparin Unfractionated: 0.68 IU/mL (ref 0.30–0.70)

## 2020-11-29 LAB — MAGNESIUM: Magnesium: 2.4 mg/dL (ref 1.7–2.4)

## 2020-11-29 MED ORDER — METHYLPREDNISOLONE SODIUM SUCC 125 MG IJ SOLR
1.0000 mg/kg | Freq: Two times a day (BID) | INTRAMUSCULAR | Status: AC
  Administered 2020-11-29 – 2020-11-30 (×2): 60 mg via INTRAVENOUS
  Filled 2020-11-29 (×2): qty 2

## 2020-11-29 MED ORDER — PANTOPRAZOLE SODIUM 40 MG PO PACK
40.0000 mg | PACK | ORAL | Status: DC
  Administered 2020-11-29 – 2020-12-07 (×9): 40 mg
  Filled 2020-11-29 (×8): qty 20

## 2020-11-29 MED ORDER — LEVOTHYROXINE SODIUM 88 MCG PO TABS
88.0000 ug | ORAL_TABLET | Freq: Every day | ORAL | Status: DC
  Administered 2020-11-29 – 2020-12-07 (×9): 88 ug
  Filled 2020-11-29 (×10): qty 1

## 2020-11-29 MED ORDER — HEPARIN SODIUM (PORCINE) 5000 UNIT/ML IJ SOLN
5000.0000 [IU] | Freq: Three times a day (TID) | INTRAMUSCULAR | Status: DC
  Administered 2020-11-29 – 2020-12-05 (×20): 5000 [IU] via SUBCUTANEOUS
  Filled 2020-11-29 (×21): qty 1

## 2020-11-29 MED ORDER — PREDNISONE 20 MG PO TABS
50.0000 mg | ORAL_TABLET | Freq: Every day | ORAL | Status: DC
  Administered 2020-11-30 – 2020-12-02 (×3): 50 mg
  Filled 2020-11-29 (×6): qty 1

## 2020-11-29 MED ORDER — ROSUVASTATIN CALCIUM 5 MG PO TABS
20.0000 mg | ORAL_TABLET | Freq: Every day | ORAL | Status: DC
  Administered 2020-11-29 – 2020-12-01 (×3): 20 mg
  Filled 2020-11-29 (×3): qty 4

## 2020-11-29 MED ORDER — LACTATED RINGERS IV SOLN: INTRAVENOUS | Status: DC

## 2020-11-29 MED ORDER — BARICITINIB 1 MG PO TABS
1.0000 mg | ORAL_TABLET | Freq: Every day | ORAL | Status: DC
  Administered 2020-11-29 – 2020-11-30 (×2): 1 mg
  Filled 2020-11-29 (×2): qty 1

## 2020-11-29 NOTE — Progress Notes (Signed)
Palliative: [name] remains critically ill.  She is lying quietly in bed intubated/ventilated and sedated.  There is no family at bedside at this time due to Covid visitor restrictions.  Thorough chart review completed and conference with CCM attending and bedside nursing staff.  Call to daughter, Abigail [name].  She states that her father goes by Abigail [name], and the entire family request that Abigail [name] be the contact person.  We talked about Abigail [name]'s acute health concerns including, but not limited to, the treatment plan, FiO2 readings, worsening kidney function, nutritional status with low albumin, A1c of 10+.  We talked about "time for outcomes".  Abigail [name] is tearful during our conversation.  She states that she understands the next few days will tell whether her mother will be able to have a meaningful improvement.  Call to husband of 31 years, Abigail [name]. We talk about her acute health issues, including but not limited to her ventilation/oxygenation, poor kidney function, and poor nutrition.  He is tearful on the phone as he talks about his love for Abigail [name] and how he doesn't want to loose her.  He asks that we do what we can to help her live.   Conference with CCM attending, bedside nursing staff, transition of care team related to patient condition, needs, goals of care.  Plan: Continue full scope/full code.  Time for outcomes.  Main contact person is daughter Abigail [name] per husband of 78 years, and daughter Abigail [name].   71 minutes Abigail Axe, NP Palliative medicine team Team phone [phone] Greater than 50% of this time was spent counseling and coordinating care related to the above assessment and plan.

## 2020-11-29 NOTE — Progress Notes (Signed)
NAME:  Abigail [name], MRN:  [id number], DOB:  August 31, 1961, LOS: 2 ADMISSION DATE:  12/02/2020, CONSULTATION DATE:  12/05/2020 REFERRING MD:  Messick-ED, CHIEF COMPLAINT:  Hypoxic respiratory failure  Brief History   59 yo female former smoker with dyspnea and fatigue for 2 weeks prior to admission.  Her husband tested positive for COVID 19 infection.  In ER she had hypoxia with SpO2 in 70's and lactic acidosis.  She then developed PEA cardiac arrest from respiratory failure, ROSC after 8 minutes, and required intubation.  Past Medical History  DM Uterine cancer Bipolar HLD NSTEMI  Significant Hospital Events   12/11 Admit, PEA in ER 12/13 d/c heparin gtt  Consults:  Palliative care  Procedures:  ETT 12/11 >> Aline 12/11 >>  Significant Diagnostic Tests:   CTA 12/11 >> No PE, bilateral dense GGO with dense consolidation in the lower lobes bilaterally  Echo 12/12 >> EF 35 to 40%, grade 2 DD, RVSP 39 mmHg  EEG 12/12 >> severe cerebral dysfunction with burst suppression  Micro Data:  12/11 COVID >> Positive 12/11 blood cultures >> 12/11 trach aspirate >> 12/12 MRSA PCR >> negative  Antimicrobials:  Ceftriaxone 12/11> Azithromycin 12/11>  Interim history/subjective:  Remains on increased PEEP/FiO2, pressors.    Objective   Blood pressure (!) 120/58, pulse 72, temperature 99.32 F (37.4 C), resp. rate (!) 24, height 5\' 2"  (1.575 m), weight 65.1 kg, SpO2 99 %.    Vent Mode: PRVC FiO2 (%):  [100 %] 100 % Set Rate:  [28 bmp] 28 bmp Vt Set:  [400 mL] 400 mL PEEP:  [14 cmH20] 14 cmH20 Plateau Pressure:  [15 cmH20-32 cmH20] 25 cmH20   Intake/Output Summary (Last 24 hours) at 11/29/2020 0826 Last data filed at 11/29/2020 0600 Gross per 24 hour  Intake 3034.33 ml  Output 345 ml  Net 2689.33 ml   Filed Weights   11/23/2020 1518 11/28/20 0333 11/29/20 0600  Weight: 59.9 kg 62 kg 65.1 kg   Physical Exam:  General - sedated Eyes - pupils reactive ENT - b/l  rhonchi Cardiac - regular rate/rhythm, no murmur Chest - equal breath sounds b/l, no wheezing or rales Abdomen - soft, non tender, decreased bowel sounds Extremities - no cyanosis, clubbing, or edema Skin - no rashes Neuro - RASS -3  Resolved Hospital Problem list     Assessment & Plan:   Acute hypoxic respiratory failure from COVID 19 pneumonia with ARDS. - full vent support - goal SpO2 > 88% - goal P plat < 30, driving force < 15 - f/u CXR, ABG - day 3 of baricitinib and solumedrol - continue ABx for possible CAP  PEA from respiratory failure. Acute systolic/diastolic CHF. Hx of CAD, HLD. - complete heparin gtt 12/13 - will need cardiology assessment if she survives this acute illness - crestor, ASA  Septic shock from COVID infection. - wean pressors to keep MAP > 65  AKI from ATN. Non gap metabolic acidosis. - baseline creatinine 1.36 from 12/12/2020 - still making urine - f/u BMET, ABG and monitor urine outpt - optimize hemodynamics - defer nephrology assessment for now  Acute metabolic encephalopathy from hypoxia, sepsis, renal failure. - RASS goal - 1 to -2 - CT head  DM type 2 poorly controlled with steroid induced hyperglycemia. - SSI and levemir  Hx of hypothyroidism. - synthroid  Elevated transaminases, likely due to sepsis. - f/u CMET  Anemia of critical illness. - f/u CBC - transfuse for Hb < 7 or significant  bleeding  Best practice (evaluated daily)   Diet: tube feeds DVT prophylaxis: SQ heparin GI prophylaxis: pantoprazole Glucose control: insulin gtt Mobility: bedrest last date of multidisciplinary goals of care discussion: 12/11; Family and staff present  Summary of discussion: Daughter Verdis Frederickson called and updated.  She confirms that family would want full resuscitation measures at this time.  We discussed the poor prognosis of Covid and severe respiratory failure requiring mechanical ventilation steroids with anoxic sequela of cardiac  arrest.  We will continue to have ongoing goals of care discussions forthcoming. Follow up goals of care discussion due - Palliative team following Code Status: full Disposition: ICU  Labs    CMP Latest Ref Rng & Units 11/29/2020 11/28/2020 11/28/2020  Glucose 70 - 99 mg/dL 159(H) - -  BUN 6 - 20 mg/dL 59(H) - -  Creatinine 0.44 - 1.00 mg/dL 2.63(H) - -  Sodium 135 - 145 mmol/L 140 137 136  Potassium 3.5 - 5.1 mmol/L 4.9 4.3 4.5  Chloride 98 - 111 mmol/L 109 - -  CO2 22 - 32 mmol/L 17(L) - -  Calcium 8.9 - 10.3 mg/dL 7.3(L) - -  Total Protein 6.5 - 8.1 g/dL 4.6(L) - -  Total Bilirubin 0.3 - 1.2 mg/dL 0.6 - -  Alkaline Phos 38 - 126 U/L 89 - -  AST 15 - 41 U/L 307(H) - -  ALT 0 - 44 U/L 189(H) - -    CBC Latest Ref Rng & Units 11/29/2020 11/28/2020 11/28/2020  WBC 4.0 - 10.5 K/uL 5.7 - -  Hemoglobin 12.0 - 15.0 g/dL 10.3(L) 9.9(L) 9.9(L)  Hematocrit 36.0 - 46.0 % 31.3(L) 29.0(L) 29.0(L)  Platelets 150 - 400 K/uL 266 - -    ABG    Component Value Date/Time   PHART 7.293 (L) 11/28/2020 1317   PCO2ART 40.0 11/28/2020 1317   PO2ART 80 (L) 11/28/2020 1317   HCO3 19.3 (L) 11/28/2020 1317   TCO2 21 (L) 11/28/2020 1317   ACIDBASEDEF 7.0 (H) 11/28/2020 1317   O2SAT 94.0 11/28/2020 1317    CBG (last 3)  Recent Labs    11/28/20 2323 11/29/20 0334 11/29/20 0746  GLUCAP 202* 171* 138*    Critical care time: 42 minutes  Chesley Mires, MD [place] Pager - [phone] 11/29/2020, 8:46 AM

## 2020-11-30 ENCOUNTER — Inpatient Hospital Stay (HOSPITAL_COMMUNITY): Payer: BLUE CROSS/BLUE SHIELD

## 2020-11-30 LAB — GLUCOSE, CAPILLARY
Glucose-Capillary: 156 mg/dL — ABNORMAL HIGH (ref 70–99)
Glucose-Capillary: 160 mg/dL — ABNORMAL HIGH (ref 70–99)
Glucose-Capillary: 164 mg/dL — ABNORMAL HIGH (ref 70–99)
Glucose-Capillary: 168 mg/dL — ABNORMAL HIGH (ref 70–99)
Glucose-Capillary: 175 mg/dL — ABNORMAL HIGH (ref 70–99)
Glucose-Capillary: 181 mg/dL — ABNORMAL HIGH (ref 70–99)
Glucose-Capillary: 182 mg/dL — ABNORMAL HIGH (ref 70–99)
Glucose-Capillary: 186 mg/dL — ABNORMAL HIGH (ref 70–99)
Glucose-Capillary: 188 mg/dL — ABNORMAL HIGH (ref 70–99)
Glucose-Capillary: 190 mg/dL — ABNORMAL HIGH (ref 70–99)
Glucose-Capillary: 191 mg/dL — ABNORMAL HIGH (ref 70–99)
Glucose-Capillary: 205 mg/dL — ABNORMAL HIGH (ref 70–99)
Glucose-Capillary: 211 mg/dL — ABNORMAL HIGH (ref 70–99)
Glucose-Capillary: 217 mg/dL — ABNORMAL HIGH (ref 70–99)
Glucose-Capillary: 223 mg/dL — ABNORMAL HIGH (ref 70–99)
Glucose-Capillary: 236 mg/dL — ABNORMAL HIGH (ref 70–99)
Glucose-Capillary: 240 mg/dL — ABNORMAL HIGH (ref 70–99)
Glucose-Capillary: 242 mg/dL — ABNORMAL HIGH (ref 70–99)
Glucose-Capillary: 250 mg/dL — ABNORMAL HIGH (ref 70–99)
Glucose-Capillary: 270 mg/dL — ABNORMAL HIGH (ref 70–99)
Glucose-Capillary: 337 mg/dL — ABNORMAL HIGH (ref 70–99)

## 2020-11-30 LAB — COMPREHENSIVE METABOLIC PANEL
ALT: 136 U/L — ABNORMAL HIGH (ref 0–44)
AST: 109 U/L — ABNORMAL HIGH (ref 15–41)
Albumin: 1.6 g/dL — ABNORMAL LOW (ref 3.5–5.0)
Alkaline Phosphatase: 115 U/L (ref 38–126)
Anion gap: 16 — ABNORMAL HIGH (ref 5–15)
BUN: 89 mg/dL — ABNORMAL HIGH (ref 6–20)
CO2: 15 mmol/L — ABNORMAL LOW (ref 22–32)
Calcium: 7.2 mg/dL — ABNORMAL LOW (ref 8.9–10.3)
Chloride: 109 mmol/L (ref 98–111)
Creatinine, Ser: 3.83 mg/dL — ABNORMAL HIGH (ref 0.44–1.00)
GFR, Estimated: 13 mL/min — ABNORMAL LOW (ref >60)
Glucose, Bld: 258 mg/dL — ABNORMAL HIGH (ref 70–99)
Potassium: 4.6 mmol/L (ref 3.5–5.1)
Sodium: 140 mmol/L (ref 135–145)
Total Bilirubin: 0.5 mg/dL (ref 0.3–1.2)
Total Protein: 4.3 g/dL — ABNORMAL LOW (ref 6.5–8.1)

## 2020-11-30 LAB — POCT I-STAT 7, (LYTES, BLD GAS, ICA,H+H)
Acid-base deficit: 9 mmol/L — ABNORMAL HIGH (ref 0.0–2.0)
Acid-base deficit: 9 mmol/L — ABNORMAL HIGH (ref 0.0–2.0)
Bicarbonate: 16.7 mmol/L — ABNORMAL LOW (ref 20.0–28.0)
Bicarbonate: 16.9 mmol/L — ABNORMAL LOW (ref 20.0–28.0)
Calcium, Ion: 1.04 mmol/L — ABNORMAL LOW (ref 1.15–1.40)
Calcium, Ion: 1.03 mmol/L — ABNORMAL LOW (ref 1.15–1.40)
HCT: 22 % — ABNORMAL LOW (ref 36.0–46.0)
HCT: 22 % — ABNORMAL LOW (ref 36.0–46.0)
Hemoglobin: 7.5 g/dL — ABNORMAL LOW (ref 12.0–15.0)
Hemoglobin: 7.5 g/dL — ABNORMAL LOW (ref 12.0–15.0)
O2 Saturation: 89 %
O2 Saturation: 97 %
Patient temperature: 36.6
Potassium: 4.7 mmol/L (ref 3.5–5.1)
Potassium: 4.7 mmol/L (ref 3.5–5.1)
Sodium: 138 mmol/L (ref 135–145)
Sodium: 139 mmol/L (ref 135–145)
TCO2: 18 mmol/L — ABNORMAL LOW (ref 22–32)
TCO2: 18 mmol/L — ABNORMAL LOW (ref 22–32)
pCO2 arterial: 36.1 mmHg (ref 32.0–48.0)
pCO2 arterial: 36.6 mmHg (ref 32.0–48.0)
pH, Arterial: 7.271 — ABNORMAL LOW (ref 7.350–7.450)
pH, Arterial: 7.273 — ABNORMAL LOW (ref 7.350–7.450)
pO2, Arterial: 63 mmHg — ABNORMAL LOW (ref 83.0–108.0)
pO2, Arterial: 99 mmHg (ref 83.0–108.0)

## 2020-11-30 LAB — BLOOD GAS, ARTERIAL
Acid-base deficit: 11.2 mmol/L — ABNORMAL HIGH (ref 0.0–2.0)
Bicarbonate: 14.8 mmol/L — ABNORMAL LOW (ref 20.0–28.0)
Drawn by: 60087
FIO2: 90
O2 Saturation: 95.6 %
Patient temperature: 37
pCO2 arterial: 35.7 mmHg (ref 32.0–48.0)
pH, Arterial: 7.24 — ABNORMAL LOW (ref 7.350–7.450)
pO2, Arterial: 93.2 mmHg (ref 83.0–108.0)

## 2020-11-30 LAB — CBC
HCT: 28 % — ABNORMAL LOW (ref 36.0–46.0)
Hemoglobin: 8.9 g/dL — ABNORMAL LOW (ref 12.0–15.0)
MCH: 30.8 pg (ref 26.0–34.0)
MCHC: 31.8 g/dL (ref 30.0–36.0)
MCV: 96.9 fL (ref 80.0–100.0)
Platelets: 181 10*3/uL (ref 150–400)
RBC: 2.89 MIL/uL — ABNORMAL LOW (ref 3.87–5.11)
RDW: 15.7 % — ABNORMAL HIGH (ref 11.5–15.5)
WBC: 3.9 10*3/uL — ABNORMAL LOW (ref 4.0–10.5)
nRBC: 2.6 % — ABNORMAL HIGH (ref 0.0–0.2)

## 2020-11-30 LAB — MAGNESIUM: Magnesium: 2.5 mg/dL — ABNORMAL HIGH (ref 1.7–2.4)

## 2020-11-30 LAB — IRON AND TIBC
Iron: 19 ug/dL — ABNORMAL LOW (ref 28–170)
Saturation Ratios: 9 % — ABNORMAL LOW (ref 10.4–31.8)
TIBC: 202 ug/dL — ABNORMAL LOW (ref 250–450)
UIBC: 183 ug/dL

## 2020-11-30 LAB — TRIGLYCERIDES: Triglycerides: 331 mg/dL — ABNORMAL HIGH (ref <150)

## 2020-11-30 LAB — PHOSPHORUS: Phosphorus: 7.6 mg/dL — ABNORMAL HIGH (ref 2.5–4.6)

## 2020-11-30 LAB — FERRITIN: Ferritin: 508 ng/mL — ABNORMAL HIGH (ref 11–307)

## 2020-11-30 MED ORDER — ALBUMIN HUMAN 25 % IV SOLN
25.0000 g | Freq: Once | INTRAVENOUS | Status: AC
  Administered 2020-11-30: 25 g via INTRAVENOUS
  Filled 2020-11-30: qty 100

## 2020-11-30 MED ORDER — SODIUM BICARBONATE 650 MG PO TABS
1300.0000 mg | ORAL_TABLET | Freq: Three times a day (TID) | ORAL | Status: DC
  Administered 2020-11-30 – 2020-12-07 (×22): 1300 mg
  Filled 2020-11-30 (×22): qty 2

## 2020-11-30 MED ORDER — INSULIN REGULAR(HUMAN) IN NACL 100-0.9 UT/100ML-% IV SOLN
INTRAVENOUS | Status: AC
  Administered 2020-11-30: 2.4 [IU]/h via INTRAVENOUS
  Administered 2020-11-30: 10.5 [IU]/h via INTRAVENOUS
  Filled 2020-11-30 (×2): qty 100

## 2020-11-30 MED ORDER — DEXTROSE 50 % IV SOLN: 0.0000 mL | INTRAVENOUS | Status: DC | PRN

## 2020-11-30 MED ORDER — FUROSEMIDE 10 MG/ML IJ SOLN
80.0000 mg | Freq: Once | INTRAMUSCULAR | Status: AC
  Administered 2020-11-30: 80 mg via INTRAVENOUS
  Filled 2020-11-30: qty 8

## 2020-11-30 NOTE — Progress Notes (Signed)
This chaplain responded to the consult for Pt. prayer.  The chaplain participated in a time of silence and prayer for the Pt. and the family.  This chaplain is available for F/U spiritual care as needed.

## 2020-11-30 NOTE — Progress Notes (Signed)
NAME:  Abigail [name], MRN:  [id number], DOB:  04/24/1961, LOS: 3 ADMISSION DATE:  12/06/2020, CONSULTATION DATE:  12/17/2020 REFERRING MD:  Messick-ED, CHIEF COMPLAINT:  Hypoxic respiratory failure  Brief History   59 yo female former smoker with dyspnea and fatigue for 2 weeks prior to admission.  Her husband tested positive for COVID 19 infection.  In ER she had hypoxia with SpO2 in 70's and lactic acidosis.  She then developed PEA cardiac arrest from respiratory failure, ROSC after 8 minutes, and required intubation.  Past Medical History  DM Uterine cancer Bipolar HLD NSTEMI  Significant Hospital Events   12/11 Admit, PEA in ER 12/13 d/c heparin gtt 12/14 renal function worse, nephrology consulted  Consults:  Palliative care Nephrology  Procedures:  ETT 12/11 >> Aline 12/11 >>  Significant Diagnostic Tests:   CTA 12/11 >> No PE, bilateral dense GGO with dense consolidation in the lower lobes bilaterally  Echo 12/12 >> EF 35 to 40%, grade 2 DD, RVSP 39 mmHg  EEG 12/12 >> severe cerebral dysfunction with burst suppression  CT head 12/13 >> no acute pathology  Micro Data:  12/11 COVID >> Positive 12/11 blood cultures >> 12/11 trach aspirate >> 12/12 MRSA PCR >> negative  Antimicrobials:  Ceftriaxone 12/11 > Azithromycin 12/11 >  Interim history/subjective:  Remains on increased PEEP/FiO2.  Minimal urine outpt.  Objective   Blood pressure (!) 117/53, pulse 68, temperature 97.7 F (36.5 C), resp. rate (!) 31, height 5\' 2"  (1.575 m), weight 66.8 kg, SpO2 91 %.    Vent Mode: PRVC FiO2 (%):  [80 %-100 %] 100 % Set Rate:  [28 bmp] 28 bmp Vt Set:  [400 mL] 400 mL PEEP:  [14 cmH20] 14 cmH20 Plateau Pressure:  [26 cmH20-32 cmH20] 30 cmH20   Intake/Output Summary (Last 24 hours) at 11/30/2020 0849 Last data filed at 11/30/2020 0600 Gross per 24 hour  Intake 3184.35 ml  Output 150 ml  Net 3034.35 ml   Filed Weights   11/28/20 0333 11/29/20 0600  11/30/20 0452  Weight: 62 kg 65.1 kg 66.8 kg   Physical Exam:  General - sedated Eyes - pupils reactive ENT - ETT in place Cardiac - regular rate/rhythm, no murmur Chest - decreased BS, b/l rhonchi Abdomen - soft, non tender, + bowel sounds Extremities - no cyanosis, clubbing, or edema Skin - no rashes Neuro - RASS -3  Resolved Hospital Problem list   Elevated transaminases likely due to sepsis.  Assessment & Plan:   Acute hypoxic respiratory failure from COVID 19 pneumonia with ARDS. - full vent support with ARDS protocol - prone positioning for PaO2:FiO2 < 150 - goal P plat < 30, driving force < 15 - f/u CXR, ABG - day 4 of baricitinib and steroids - day 4 of 7 ABx for CAP  PEA from respiratory failure. Acute systolic/diastolic CHF. Hx of CAD, HLD. - will need cardiology assessment if she survives this acute illness - continue crestor, ASA  Septic shock from COVID infection. - wean pressors to keep MAP > 65  AKI from ATN. Non gap metabolic acidosis. - baseline creatinine 1.36 from 12/17/2020 - renal fx, acidosis worse 12/14 - nephrology consulted  Acute metabolic encephalopathy from hypoxia, sepsis, renal failure. - CT head 12/13 unremarkable - RASS goal -1 to -2  DM type 2 poorly controlled with steroid induced hyperglycemia. - insulin gtt started 12/14  Hx of hypothyroidism. - continue synthroid  Anemia of critical illness. - f/u CBC - transfuse for  Hb < 7 or significant bleeding  Goals of care. - palliative care d/w family on 12/13 >> continue aggressive therapies  Best practice  Diet: tube feeds DVT prophylaxis: SQ heparin GI prophylaxis: pantoprazole Mobility: bedrest Code Status: full Disposition: ICU  Labs    CMP Latest Ref Rng & Units 11/30/2020 11/29/2020 11/28/2020  Glucose 70 - 99 mg/dL 258(H) 159(H) -  BUN 6 - 20 mg/dL 89(H) 59(H) -  Creatinine 0.44 - 1.00 mg/dL 3.83(H) 2.63(H) -  Sodium 135 - 145 mmol/L 140 140 137  Potassium 3.5  - 5.1 mmol/L 4.6 4.9 4.3  Chloride 98 - 111 mmol/L 109 109 -  CO2 22 - 32 mmol/L 15(L) 17(L) -  Calcium 8.9 - 10.3 mg/dL 7.2(L) 7.3(L) -  Total Protein 6.5 - 8.1 g/dL 4.3(L) 4.6(L) -  Total Bilirubin 0.3 - 1.2 mg/dL 0.5 0.6 -  Alkaline Phos 38 - 126 U/L 115 89 -  AST 15 - 41 U/L 109(H) 307(H) -  ALT 0 - 44 U/L 136(H) 189(H) -    CBC Latest Ref Rng & Units 11/30/2020 11/29/2020 11/28/2020  WBC 4.0 - 10.5 K/uL 3.9(L) 5.7 -  Hemoglobin 12.0 - 15.0 g/dL 8.9(L) 10.3(L) 9.9(L)  Hematocrit 36.0 - 46.0 % 28.0(L) 31.3(L) 29.0(L)  Platelets 150 - 400 K/uL 181 266 -    ABG    Component Value Date/Time   PHART 7.240 (L) 11/30/2020 0545   PCO2ART 35.7 11/30/2020 0545   PO2ART 93.2 11/30/2020 0545   HCO3 14.8 (L) 11/30/2020 0545   TCO2 21 (L) 11/28/2020 1317   ACIDBASEDEF 11.2 (H) 11/30/2020 0545   O2SAT 95.6 11/30/2020 0545    CBG (last 3)  Recent Labs    11/30/20 0559 11/30/20 0655 11/30/20 0735  GLUCAP 217* 211* 181*    Critical care time: 38 minutes  Chesley Mires, MD [place] Pager - [phone] 11/30/2020, 8:49 AM

## 2020-11-30 NOTE — Progress Notes (Signed)
eLink Physician-Brief Progress Note Patient Name: Abigail [name] DOB: [birth year] MRN: [id number]   Date of Service  11/30/2020  HPI/Events of Note  Hyperglycemia - Blood glucose = 164 --> 211 --> 255 --> 311. Currently on Levemir + Q 4 hour resistant Novolog SSI.  eICU Interventions  Plan: 1. D/C current blood glucose management. 2. Insulin IV infusion per [facility].      Intervention Category Major Interventions: Hyperglycemia - active titration of insulin therapy  Lauris Serviss [place] 11/30/2020, 12:12 AM

## 2020-11-30 NOTE — Progress Notes (Signed)
Palliative: [name], [name], remains critically ill.  She is intubated/ventilated and sedated.  Her kidney function has worsened again today.  There is no family at bedside at this time due to Covid visitor restrictions.   Call to daughter, Abigail [name], who tells me that she is indeed the main contact person.  Patients spouse, Abigail [name], asked that I have Abigail [name] listed as the main contact person.  Abigail [name] and I talk in detail about the treatment plan including, but not limited to, ventilator support and settings, selected labs, nutrition, medications.  We also talked in detail about kidney function and the treatment plan of Lasix and albumin as trial to jumpstart kidney function.  At this point family is agreeable to continuous hemodialysis if needed.  No questions from and at this time.  She seems to be able to accurately state the treatment plan.  Conference with attending and bedside nursing staff related to patient condition, needs, goals of care.  Plan:   Continue full scope/full code.  Agreeable to hemodialysis if needed.  Family agreeable to all treatment modalities that would preserve/prolong life.     79 minutes  Abigail Axe, NP Palliative medicine team Team phone [phone] Greater than 50% of this time was spent counseling and coordinating care related to the above assessment and plan.

## 2020-11-30 NOTE — Progress Notes (Signed)
Spoke with pt's daughter, Vicente Males.  Explained current status and that renal fx has gotten worse.  Explained that nephrology consulted and trying forced diuresis, but still might need CRRT if no improvement in next 24 to 48 hrs.  Chesley Mires, MD [place] Pager - [phone] 11/30/2020, 11:31 AM

## 2020-11-30 NOTE — Consult Note (Addendum)
Reason for Consult: AKI Referring Physician: Dr. [name] is an 59 y.o. female.  HPI: This is a 59 year old female with a history of bipolar disorder, CAD with NSTEMI in 2017 s/p angioplasty, DM, HLD, and uterine cancer who presented after not feeling well for approximately 2 weeks and had worsening breathing. Noted to be hypoxic down to the 70s requiring a NRB and found to have COVID-pneumonia. Had a PEA arrest in the emergency department, obtained ROSC after 8 minutes, and was intubated and sent to the ICU. Patient had septic shock and required levophed to maintain MAP >65. Patient is currently receiving baricitinib and prednisone for COVID. Noted to have worsening kidney function, on admission Cr was 1.36, has been increasing and now up to 3.83 today, BUN up to 89 today. She also has had decreased urinary output, only noted 150 UOP yesterday. Weight has been increasing and is up to 66.8 kg today. Patient was currently intubated and sedated, unable to provide further history, history obtained from chart review.   Trend in Creatinine: Creat  Date/Time Value Ref Range Status  04/25/2016 10:37 AM 0.70 0.50 - 1.05 mg/dL Final   Creatinine, Ser  Date/Time Value Ref Range Status  11/30/2020 04:49 AM 3.83 (H) 0.44 - 1.00 mg/dL Final    Comment:    DELTA CHECK NOTED  11/29/2020 05:18 AM 2.63 (H) 0.44 - 1.00 mg/dL Final  11/28/2020 04:28 AM 1.75 (H) 0.44 - 1.00 mg/dL Final  11/26/2020 07:36 PM 1.43 (H) 0.44 - 1.00 mg/dL Final  12/13/2020 03:27 PM 1.36 (H) 0.44 - 1.00 mg/dL Final  04/16/2016 05:08 AM 0.57 0.44 - 1.00 mg/dL Final  04/15/2016 03:58 AM 0.60 0.44 - 1.00 mg/dL Final  04/13/2016 06:48 AM 0.62 0.44 - 1.00 mg/dL Final  04/13/2016 12:55 AM 0.62 0.44 - 1.00 mg/dL Final  04/12/2016 08:17 PM 0.63 0.44 - 1.00 mg/dL Final    PMH:   Past Medical History:  Diagnosis Date  . Bipolar disorder ([place])   . CAD S/P percutaneous coronary angioplasty 04/06/2016   2 Vessel CAD:  Ost-prox-mid RCA (80%, 80% & 99%) -> Synergy DES 2.5 x 38, mid-dist RCA -> Synergy DES 2.25 x 20.  mid LAD 80% -> Synergy DES 3.0 x 12 (3.3 mm), dist LAD 85%, D1 occluded-> Synergy DES 3.0 x 12 (3.3 mm)   . Cancer ([place])    uterus  . Diabetes mellitus without complication ([place])   . Hypertriglyceridemia   . Non-STEMI (non-ST elevated myocardial infarction) ([place]) 03/2016   Found to have severe 2 vessel disease involving the ostial to mid RCA and then mid to distal RCA as well as mid and distal LAD. - 4 site PCI    PSH:   Past Surgical History:  Procedure Laterality Date  . ABDOMINAL HYSTERECTOMY    . CARDIAC CATHETERIZATION N/A 04/14/2016   Procedure: Left Heart Cath and Coronary Angiography;  Surgeon: Abigail Sine, MD;  Location: [facility];  Service: Cardiovascular: NSTEMI -> ost RCA 80%, prox RCA 80%, mid RCA 99%, mid-dist RCA 85% (pre bifurcation). Mid LAD-Diag1 85% (with D1 85% - occluded post PCI of LAD, small caliber), dist LAD 85%.  [name] CARDIAC CATHETERIZATION N/A 04/14/2016   Procedure: Coronary Stent Intervention;  Surgeon: Abigail Sine, MD;  Location: [facility]: 2 Vessel, 2 non-overlapping stents each: ost-mid RCA (3 lesions) - Synergy DES 2.5 mm x 38 mm, mid-dist RCA - Syergy DES 2.25 mm x 20 mm. mid LAD  3.0 mm x 12 mm (3.3 mm), dist LAD Synergy DES 3.0 mm x 24 mm (3.3 mm) -loss of flow and small D1  . TRANSTHORACIC ECHOCARDIOGRAM  03/2016   Post NSTEMI:  Normal LV size and function. EF 55-60%. Normal diastolic parameters. Mild LA dilation. Trivial AI    Allergies: No Known Allergies  Medications:   Prior to Admission medications   Medication Sig Start Date End Date Taking? Authorizing Provider  fenofibrate 160 MG tablet Take 1 tablet (160 mg total) by mouth daily. 04/16/16  Yes Theodis Blaze, MD  insulin degludec (TRESIBA) 100 UNIT/ML FlexTouch Pen Inject 10 Units into the skin daily. Increase by 2 units every 3 days for AM average 3 day fasting BS of greater  than 130. Decrease by 2 units every 3 days if average 3 day AM fasting BS of 80 or less. Max dose 50 units daily. 09/15/20  Yes [provider]  levothyroxine (SYNTHROID) 88 MCG tablet Take 88 mcg by mouth daily. 09/15/20  Yes [provider]  losartan (COZAAR) 50 MG tablet Take 50 mg by mouth daily. 09/15/20  Yes [provider]  metFORMIN (GLUCOPHAGE) 1000 MG tablet Take 1,000 mg by mouth 2 (two) times daily. 09/15/20  Yes [provider]  rosuvastatin (CRESTOR) 20 MG tablet Take 20 mg by mouth at bedtime. 10/31/20  Yes [provider]  blood glucose meter kit and supplies KIT Dispense based on patient and insurance preference. Use up to four times daily as directed. (FOR ICD-9 250.00, 250.01). 04/16/16   Theodis Blaze, MD  BRILINTA 60 MG TABS tablet TAKE 1 TABLET BY MOUTH TWICE DAILY **PT  NEEDS  APPOINTMENT** Patient taking differently: Take 60 mg by mouth 2 (two) times daily. 05/15/19   Leonie Man, MD  BRILINTA 90 MG TABS tablet TAKE 1 TABLET BY MOUTH TWICE DAILY Patient taking differently: Take 90 mg by mouth 2 (two) times daily. 05/14/18   Leonie Man, MD  docusate sodium (COLACE) 100 MG capsule Take 100 mg by mouth 2 (two) times daily.    [provider]  empagliflozin (JARDIANCE) 25 MG TABS tablet Take 25 mg by mouth daily.    [provider]  ferrous sulfate 324 (65 Fe) MG TBEC Take by mouth.    [provider]  insulin glargine (LANTUS) 100 UNIT/ML injection Inject 0.1 mLs (10 Units total) into the skin at bedtime. Patient taking differently: Inject 14-17 Units into the skin at bedtime. 04/16/16   Theodis Blaze, MD    Inpatient medications: . aspirin  81 mg Per Tube Daily  . baricitinib  1 mg Per Tube Daily  . chlorhexidine gluconate (MEDLINE KIT)  15 mL Mouth Rinse BID  . Chlorhexidine Gluconate Cloth  6 each Topical Daily  . docusate  100 mg Per Tube BID  . feeding supplement (PROSource TF)  45 mL Per  Tube TID  . heparin injection (subcutaneous)  5,000 Units Subcutaneous Q8H  . levothyroxine  88 mcg Per Tube Daily  . mouth rinse  15 mL Mouth Rinse 10 times per day  . pantoprazole sodium  40 mg Per Tube Q24H  . polyethylene glycol  17 g Per Tube Daily  . predniSONE  50 mg Per Tube Daily  . rosuvastatin  20 mg Per Tube QHS    Discontinued Meds:   Medications Discontinued During This Encounter  Medication Reason  . heparin injection 5,000 Units   . heparin injection 5,000 Units   .  levothyroxine (SYNTHROID, LEVOTHROID) 75 MCG tablet Dose change  . losartan (COZAAR) 25 MG tablet Dose change  . metFORMIN (GLUCOPHAGE) 500 MG tablet Dose change  . docusate sodium (COLACE) capsule 100 mg   . aspirin EC tablet 81 mg   . polyethylene glycol (MIRALAX / GLYCOLAX) packet 17 g   . acetaminophen (TYLENOL) tablet 650 mg   . docusate (COLACE) 50 MG/5ML liquid 045 mg Duplicate  . polyethylene glycol (MIRALAX / GLYCOLAX) packet 17 g Duplicate  . remdesivir 100 mg in sodium chloride 0.9 % 100 mL IVPB   . heparin ADULT infusion 100 units/mL (25000 units/234m sodium chloride 0.45%)   . baricitinib (OLUMIANT) tablet 2 mg   . methylPREDNISolone sodium succinate (SOLU-MEDROL) 125 mg/2 mL injection 60 mg   . predniSONE (DELTASONE) tablet 50 mg   . lactated ringers infusion   . dextrose 5 % in lactated ringers infusion   . pantoprazole (PROTONIX) injection 40 mg   . atorvastatin (LIPITOR) 80 MG tablet   . dextrose 50 % solution 0-50 mL   . insulin detemir (LEVEMIR) injection 5 Units   . insulin aspart (novoLOG) injection 3-9 Units   . lactated ringers infusion     Social History:  reports that she quit smoking about 4 years ago. She has never used smokeless tobacco. She reports that she does not drink alcohol and does not use drugs.  Family History:   Family History  Problem Relation Age of Onset  . CAD Mother        s/p CABG  . Heart disease Father   . Prostate cancer Father   . Heart  attack Maternal Grandfather   . Heart attack Paternal Grandfather     Pertinent items are noted in HPI. Weight change: 1.7 kg  Intake/Output Summary (Last 24 hours) at 11/30/2020 1020 Last data filed at 11/30/2020 0900 Gross per 24 hour  Intake 3403.1 ml  Output 150 ml  Net 3253.1 ml   BP 110/62   Pulse 70   Temp 98.06 F (36.7 C)   Resp (!) 23   Ht 5' 2"  (1.575 m)   Wt 66.8 kg   SpO2 92%   BMI 26.94 kg/m  Vitals:   11/30/20 0700 11/30/20 0730 11/30/20 0800 11/30/20 0900  BP: (!) 100/57 (!) 117/53 (!) 106/57 110/62  Pulse: 70 68 69 70  Resp: (!) 24 (!) 31 (!) 24 (!) 23  Temp: 97.7 F (36.5 C) 97.7 F (36.5 C) 97.7 F (36.5 C) 98.06 F (36.7 C)  TempSrc: Bladder  Bladder   SpO2: 93% 91% 92% 92%  Weight:      Height:         General appearance: no distress and sedated Head: Normocephalic, without obvious abnormality, atraumatic Throat: Intubated Resp: Decreased breath sound throughout, no rales noted Cardio: regular rate and rhythm, S1, S2 normal, no murmur, click, rub or gallop GI: soft, non-tender; bowel sounds normal; no masses,  no organomegaly Extremities: extremities normal, atraumatic, no cyanosis or edema Pulses: 2+ and symmetric Skin: Skin color, texture, turgor normal. No rashes or lesions Neurologic: Intubated and sedated  Labs: Basic Metabolic Panel: Recent Labs  Lab 12/06/2020 1527 11/22/2020 1936 11/28/20 0309 11/28/20 0428 11/28/20 0801 11/28/20 1317 11/28/20 1349 11/28/20 1640 11/29/20 0518 11/30/20 0449  NA 129* 136 135 134* 136 137  --   --  140 140  K 5.3* 4.5 4.3 4.4 4.5 4.3  --   --  4.9 4.6  CL 94* 104  --  104  --   --   --   --  109 109  CO2 17* 9*  --  17*  --   --   --   --  17* 15*  GLUCOSE 385* 495*  --  246*  --   --   --   --  159* 258*  BUN 44* 44*  --  44*  --   --   --   --  59* 89*  CREATININE 1.36* 1.43*  --  1.75*  --   --   --   --  2.63* 3.83*  ALBUMIN 2.9*  --   --  1.9*  --   --   --   --  1.7* 1.6*  CALCIUM  8.2* 7.3*  --  7.7*  --   --   --   --  7.3* 7.2*  PHOS  --   --   --   --   --   --  4.2 4.2 6.1* 7.6*   Liver Function Tests: Recent Labs  Lab 11/28/20 0428 11/29/20 0518 11/30/20 0449  AST 1,695* 307* 109*  ALT 314* 189* 136*  ALKPHOS 88 89 115  BILITOT 0.4 0.6 0.5  PROT 4.7* 4.6* 4.3*  ALBUMIN 1.9* 1.7* 1.6*   No results for input(s): LIPASE, AMYLASE in the last 168 hours. No results for input(s): AMMONIA in the last 168 hours. CBC: Recent Labs  Lab 11/26/2020 1527 11/28/20 0309 11/28/20 0428 11/28/20 0801 11/28/20 1317 11/29/20 0518 11/30/20 0449  WBC 6.2  --  2.2*  --   --  5.7 3.9*  NEUTROABS 5.1  --   --   --   --   --   --   HGB 12.0   < > 10.3* 9.9* 9.9* 10.3* 8.9*  HCT 36.7   < > 30.2* 29.0* 29.0* 31.3* 28.0*  MCV 95.3  --  93.2  --   --  94.3 96.9  PLT 329  --  229  --   --  266 181   < > = values in this interval not displayed.   PT/INR: @LABRCNTIP (inr:5) Cardiac Enzymes: )No results for input(s): CKTOTAL, CKMB, CKMBINDEX, TROPONINI in the last 168 hours. CBG: Recent Labs  Lab 11/30/20 0559 11/30/20 0655 11/30/20 0735 11/30/20 0858 11/30/20 1012  GLUCAP 217* 211* 181* 156* 160*    Iron Studies:  Recent Labs  Lab 11/26/2020 1527  FERRITIN 1,472*    Xrays/Other Studies: EEG  Result Date: 11/28/2020 Greta Doom, MD     11/28/2020  7:35 PM History: 59 year old female being evaluated after cardiac arrest Sedation: Propofol 40 mcg/KG/min Technique: This is a 21 channel routine scalp EEG performed at the bedside with bipolar and monopolar montages arranged in accordance to the international 10/20 system of electrode placement. One channel was dedicated to EKG recording. Background: The background consists of relatively attenuated background with  bursts of smoothly contoured delta and theta range activity.  There is no clinical correlate associated with these bursts, and the periods of attenuation are relatively brief, 1 to 4 seconds. Photic  stimulation: Physiologic driving is now performed EEG Abnormalities: 1) burst suppression Clinical Interpretation: This EEG is consistent with a severe cerebral dysfunction as can be seen with hypoxic brain injury or medically induced coma.  Given the presence of propofol sedation, lack of clinical correlate with the burst, and benign appearance of the burst, I do not know that this EEG carries the dismal prognostic significance that a burst  suppression pattern can in this context, however it certainly does not exclude severe anoxic injury either. There was no seizure or seizure predisposition recorded on this study. Please note that lack of epileptiform activity on EEG does not preclude the possibility of epilepsy. Roland Rack, MD [facility] [phone] If 7pm- 7am, please page neurology on call as listed in [place].   CT HEAD WO CONTRAST  Result Date: 11/29/2020 CLINICAL DATA:  Anoxic brain damage EXAM: CT HEAD WITHOUT CONTRAST TECHNIQUE: Contiguous axial images were obtained from the base of the skull through the vertex without intravenous contrast. COMPARISON:  None. FINDINGS: Brain: No evidence of acute infarction, hemorrhage, hydrocephalus, extra-axial collection or mass lesion/mass effect. Vascular: No hyperdense vessel or unexpected calcification. Skull: No osseous abnormality. Sinuses/Orbits: Visualized paranasal sinuses are clear. Visualized mastoid sinuses are clear. Visualized orbits demonstrate no focal abnormality. Other: None IMPRESSION: No acute intracranial pathology. Electronically Signed   By: Kathreen Devoid   On: 11/29/2020 13:41   DG Chest Port 1 View  Result Date: 11/30/2020 CLINICAL DATA:  Respiratory distress syndrome.  COVID. EXAM: PORTABLE CHEST 1 VIEW COMPARISON:  CT chest 11/21/2020.  Chest x-ray 11/19/2020. FINDINGS: Endotracheal tube tip 1 cm above the lower portion of the carina. Proximal repositioning of approximately 2 cm suggested. NG tube repositioned  with tip over the stomach on today's exam. Heart size normal. Low lung volumes with bibasilar atelectasis. Diffuse bilateral pulmonary infiltrates/edema. No prominent pleural effusion. No pneumothorax. IMPRESSION: 1. Endotracheal tube tip 1 cm above the lower portion of the carina. Proximal repositioning of approximately 2 cm suggested. 2. NG tube repositioned with tip over the stomach on today's exam. 3. Low lung volumes with bibasilar atelectasis. Diffuse bilateral pulmonary infiltrates/edema. Electronically Signed   By: Marcello Moores  Register   On: 11/30/2020 05:28   ECHOCARDIOGRAM LIMITED  Result Date: 11/28/2020    ECHOCARDIOGRAM LIMITED REPORT   Patient Name:   Abigail [name] Date of Exam: 11/28/2020 Medical Rec #:  [id number]            Height:       62.0 in Accession #:    [id number]           Weight:       136.7 lb Date of Birth:  [birth year]           BSA:          1.626 m Patient Age:    45 years             BP:           106/59 mmHg Patient Gender: F                    HR:           72 bpm. Exam Location:  Inpatient Procedure: Limited Echo, Cardiac Doppler and Color Doppler Indications:    Cardiac arrest  History:        Patient has prior history of Echocardiogram examinations, most                 recent 04/14/2016. Previous Myocardial Infarction,                 Arrythmias:Cardiac Arrest; Risk Factors:Diabetes and                 Dyslipidemia. COVID+.  Sonographer:    Dustin Flock Referring Phys: [id number] [place]  1. When compared to the prior study from 04/14/2016 LVEF has decreased  from 55-60% to 35-40% with hypokinesis in all of the apical walls and mid anteroseptal, anterior and anterolateral walls.  2. Left ventricular ejection fraction, by estimation, is 35 to 40%. The left ventricle has moderately decreased function. The left ventricle demonstrates regional wall motion abnormalities (see scoring diagram/findings for description). Left ventricular  diastolic parameters  are consistent with Grade II diastolic dysfunction (pseudonormalization). Elevated left atrial pressure.  3. Right ventricular systolic function is mildly reduced. The right ventricular size is mildly enlarged. There is mildly elevated pulmonary artery systolic pressure. The estimated right ventricular systolic pressure is 15.1 mmHg.  4. Left atrial size was mildly dilated.  5. The aortic valve is tricuspid. There is moderate calcification of the aortic valve. There is moderate thickening of the aortic valve. Aortic valve regurgitation is mild. Mild to moderate aortic valve sclerosis/calcification is present, without any evidence of aortic stenosis. FINDINGS  Left Ventricle: Left ventricular ejection fraction, by estimation, is 35 to 40%. The left ventricle has moderately decreased function. The left ventricle demonstrates regional wall motion abnormalities. Left ventricular diastolic parameters are consistent with Grade II diastolic dysfunction (pseudonormalization). Elevated left atrial pressure.  LV Wall Scoring: The mid and distal anterior wall, mid and distal anterior septum, entire apex, and mid anterolateral segment are hypokinetic. Right Ventricle: The right ventricular size is mildly enlarged. Right ventricular systolic function is mildly reduced. There is mildly elevated pulmonary artery systolic pressure. The tricuspid regurgitant velocity is 3.00 m/s, and with an assumed right atrial pressure of 3 mmHg, the estimated right ventricular systolic pressure is 76.1 mmHg. Left Atrium: Left atrial size was mildly dilated. Tricuspid Valve: Tricuspid valve regurgitation is mild. Aortic Valve: The aortic valve is tricuspid. There is moderate calcification of the aortic valve. There is moderate thickening of the aortic valve. Aortic valve regurgitation is mild. Mild to moderate aortic valve sclerosis/calcification is present, without any evidence of aortic stenosis. LEFT VENTRICLE PLAX 2D LVIDd:         4.60 cm   Diastology LVIDs:         3.30 cm  LV e' medial:    6.20 cm/s LV PW:         1.00 cm  LV E/e' medial:  13.1 LV IVS:        0.90 cm  LV e' lateral:   7.07 cm/s LVOT diam:     2.20 cm  LV E/e' lateral: 11.5 LVOT Area:     3.80 cm  LEFT ATRIUM           Index LA diam:      3.40 cm 2.09 cm/m LA Vol (A2C): 55.3 ml 34.01 ml/m LA Vol (A4C): 42.2 ml 25.95 ml/m   AORTA Ao Root diam: 2.50 cm MITRAL VALVE               TRICUSPID VALVE MV Area (PHT): 3.85 cm    TR Peak grad:   36.0 mmHg MV Decel Time: 197 msec    TR Vmax:        300.00 cm/s MV E velocity: 81.00 cm/s MV A velocity: 52.30 cm/s  SHUNTS MV E/A ratio:  1.55        Systemic Diam: 2.20 cm Ena Dawley MD Electronically signed by Ena Dawley MD Signature Date/Time: 11/28/2020/1:03:48 PM    Final      Assessment/Plan: 1. AKI with Oliguria: This is likely 2/2 ATN from her PEA and septic shock. Last Cr noted in chart was in 2017 and they were around  0.5-0.6. On admission Cr up to 1.36 and has been up trending and now up to 3.83 this morning. BUN also continues to worsen. Minimal UOP noted. Patient here with COVID-pneumonia, ARDS, and septic shock, receiving baricitinib and steroid as well as antibiotics for CAP coverage.  Levophed was stopped this morning, BP has been maintaining MAPs >65. Patient has metabolic acidosis, pH of 5.521 today and Bicarb 16. May need to discuss CRRT with family if she requires this in the future. No emergent need at this time however kidney function does not appear to be improving. Patient is up 10L during admission and with her oliguria will trial a dose of lasix and albumin to see if we improve urinary output.  1. Daily BMP 2. Strict I+Os, daily weights 3. Trial of albumin and lasix 4. Consider checking urine studies  2. Metabolic acidosis: CO2 today was 15, worsening while here. Start Na Bicarb.  3. Anemia: Hgb down to 8.9 today. No obvious source of bleeding noted.  Will check iron studies. Would hold off on  replacement at this time. 4. Septic shock from COVID-Pneumonia: Also on CAP coverage with azithromycin and ceftriaxone. Patient is currently intubated. Continue tx per primary.  5. DM type 2: Per primary 6. Hypothyroidism: Per primary 7. GOC: Palliative care on board, continue aggressive therapies for now. Will need to continue to address this.     Asencion Noble 11/30/2020, 10:20 AM

## 2020-11-30 NOTE — Progress Notes (Signed)
Assisted tele visit to patient with family member.  Wilian Kwong Anderson, RN   

## 2020-12-01 ENCOUNTER — Inpatient Hospital Stay (HOSPITAL_COMMUNITY): Payer: BLUE CROSS/BLUE SHIELD

## 2020-12-01 DIAGNOSIS — Z452 Encounter for adjustment and management of vascular access device: Secondary | ICD-10-CM

## 2020-12-01 LAB — POCT ACTIVATED CLOTTING TIME
Activated Clotting Time: 148 seconds
Activated Clotting Time: 166 seconds
Activated Clotting Time: 166 seconds
Activated Clotting Time: 172 seconds
Activated Clotting Time: 178 seconds
Activated Clotting Time: 172 seconds
Activated Clotting Time: 178 seconds
Activated Clotting Time: 178 seconds
Activated Clotting Time: 184 seconds
Activated Clotting Time: 184 seconds

## 2020-12-01 LAB — BLOOD GAS, ARTERIAL
Acid-base deficit: 10.9 mmol/L — ABNORMAL HIGH (ref 0.0–2.0)
Bicarbonate: 15.2 mmol/L — ABNORMAL LOW (ref 20.0–28.0)
Drawn by: 60057
FIO2: 100
O2 Saturation: 97.9 %
Patient temperature: 36.8
pCO2 arterial: 37.3 mmHg (ref 32.0–48.0)
pH, Arterial: 7.233 — ABNORMAL LOW (ref 7.350–7.450)
pO2, Arterial: 140 mmHg — ABNORMAL HIGH (ref 83.0–108.0)

## 2020-12-01 LAB — BASIC METABOLIC PANEL
Anion gap: 17 — ABNORMAL HIGH (ref 5–15)
BUN: 113 mg/dL — ABNORMAL HIGH (ref 6–20)
CO2: 16 mmol/L — ABNORMAL LOW (ref 22–32)
Calcium: 7 mg/dL — ABNORMAL LOW (ref 8.9–10.3)
Chloride: 107 mmol/L (ref 98–111)
Creatinine, Ser: 4.53 mg/dL — ABNORMAL HIGH (ref 0.44–1.00)
GFR, Estimated: 11 mL/min — ABNORMAL LOW (ref >60)
Glucose, Bld: 183 mg/dL — ABNORMAL HIGH (ref 70–99)
Potassium: 4.7 mmol/L (ref 3.5–5.1)
Sodium: 140 mmol/L (ref 135–145)

## 2020-12-01 LAB — CBC
HCT: 23.8 % — ABNORMAL LOW (ref 36.0–46.0)
Hemoglobin: 7.9 g/dL — ABNORMAL LOW (ref 12.0–15.0)
MCH: 31.6 pg (ref 26.0–34.0)
MCHC: 33.2 g/dL (ref 30.0–36.0)
MCV: 95.2 fL (ref 80.0–100.0)
Platelets: 119 10*3/uL — ABNORMAL LOW (ref 150–400)
RBC: 2.5 MIL/uL — ABNORMAL LOW (ref 3.87–5.11)
RDW: 16.2 % — ABNORMAL HIGH (ref 11.5–15.5)
WBC: 3.8 10*3/uL — ABNORMAL LOW (ref 4.0–10.5)
nRBC: 7.7 % — ABNORMAL HIGH (ref 0.0–0.2)

## 2020-12-01 LAB — GLUCOSE, CAPILLARY
Glucose-Capillary: 172 mg/dL — ABNORMAL HIGH (ref 70–99)
Glucose-Capillary: 178 mg/dL — ABNORMAL HIGH (ref 70–99)
Glucose-Capillary: 156 mg/dL — ABNORMAL HIGH (ref 70–99)
Glucose-Capillary: 152 mg/dL — ABNORMAL HIGH (ref 70–99)
Glucose-Capillary: 167 mg/dL — ABNORMAL HIGH (ref 70–99)
Glucose-Capillary: 145 mg/dL — ABNORMAL HIGH (ref 70–99)
Glucose-Capillary: 163 mg/dL — ABNORMAL HIGH (ref 70–99)
Glucose-Capillary: 149 mg/dL — ABNORMAL HIGH (ref 70–99)
Glucose-Capillary: 174 mg/dL — ABNORMAL HIGH (ref 70–99)
Glucose-Capillary: 180 mg/dL — ABNORMAL HIGH (ref 70–99)
Glucose-Capillary: 194 mg/dL — ABNORMAL HIGH (ref 70–99)
Glucose-Capillary: 187 mg/dL — ABNORMAL HIGH (ref 70–99)
Glucose-Capillary: 179 mg/dL — ABNORMAL HIGH (ref 70–99)
Glucose-Capillary: 234 mg/dL — ABNORMAL HIGH (ref 70–99)

## 2020-12-01 LAB — RENAL FUNCTION PANEL
Albumin: 2.1 g/dL — ABNORMAL LOW (ref 3.5–5.0)
Anion gap: 16 — ABNORMAL HIGH (ref 5–15)
BUN: 91 mg/dL — ABNORMAL HIGH (ref 6–20)
CO2: 18 mmol/L — ABNORMAL LOW (ref 22–32)
Calcium: 7.1 mg/dL — ABNORMAL LOW (ref 8.9–10.3)
Chloride: 106 mmol/L (ref 98–111)
Creatinine, Ser: 3.49 mg/dL — ABNORMAL HIGH (ref 0.44–1.00)
GFR, Estimated: 15 mL/min — ABNORMAL LOW (ref >60)
Glucose, Bld: 203 mg/dL — ABNORMAL HIGH (ref 70–99)
Phosphorus: 6.5 mg/dL — ABNORMAL HIGH (ref 2.5–4.6)
Potassium: 4.8 mmol/L (ref 3.5–5.1)
Sodium: 140 mmol/L (ref 135–145)

## 2020-12-01 MED ORDER — BARICITINIB 1 MG PO TABS
2.0000 mg | ORAL_TABLET | Freq: Every day | ORAL | Status: DC
  Administered 2020-12-01 – 2020-12-06 (×6): 2 mg
  Filled 2020-12-01 (×6): qty 2

## 2020-12-01 MED ORDER — PRISMASOL BGK 4/2.5 32-4-2.5 MEQ/L REPLACEMENT SOLN
Status: DC
  Filled 2020-12-01 (×20): qty 5000

## 2020-12-01 MED ORDER — DEXTROSE 10 % IV SOLN: INTRAVENOUS | Status: DC | PRN

## 2020-12-01 MED ORDER — INSULIN ASPART 100 UNIT/ML ~~LOC~~ SOLN
3.0000 [IU] | SUBCUTANEOUS | Status: DC
  Administered 2020-12-01: 6 [IU] via SUBCUTANEOUS
  Administered 2020-12-01: 9 [IU] via SUBCUTANEOUS
  Administered 2020-12-01 – 2020-12-02 (×3): 6 [IU] via SUBCUTANEOUS
  Administered 2020-12-02: 3 [IU] via SUBCUTANEOUS
  Administered 2020-12-02: 6 [IU] via SUBCUTANEOUS
  Administered 2020-12-02: 3 [IU] via SUBCUTANEOUS
  Administered 2020-12-03: 6 [IU] via SUBCUTANEOUS
  Administered 2020-12-03: 9 [IU] via SUBCUTANEOUS
  Administered 2020-12-03 – 2020-12-04 (×7): 6 [IU] via SUBCUTANEOUS
  Administered 2020-12-04: 9 [IU] via SUBCUTANEOUS
  Administered 2020-12-04: 3 [IU] via SUBCUTANEOUS
  Administered 2020-12-04: 9 [IU] via SUBCUTANEOUS
  Administered 2020-12-05 (×4): 6 [IU] via SUBCUTANEOUS
  Administered 2020-12-05: 3 [IU] via SUBCUTANEOUS
  Administered 2020-12-05: 6 [IU] via SUBCUTANEOUS
  Administered 2020-12-06: 3 [IU] via SUBCUTANEOUS
  Administered 2020-12-06: 9 [IU] via SUBCUTANEOUS
  Administered 2020-12-06 (×3): 3 [IU] via SUBCUTANEOUS
  Administered 2020-12-07 (×2): 6 [IU] via SUBCUTANEOUS

## 2020-12-01 MED ORDER — HEPARIN SODIUM (PORCINE) 1000 UNIT/ML DIALYSIS
1000.0000 [IU] | INTRAMUSCULAR | Status: DC | PRN
  Administered 2020-12-05: 2100 [IU] via INTRAVENOUS_CENTRAL
  Administered 2020-12-05: 1700 [IU] via INTRAVENOUS_CENTRAL
  Filled 2020-12-01: qty 6
  Filled 2020-12-01: qty 4
  Filled 2020-12-01: qty 6
  Filled 2020-12-01: qty 4
  Filled 2020-12-01: qty 6

## 2020-12-01 MED ORDER — INSULIN DETEMIR 100 UNIT/ML ~~LOC~~ SOLN
12.0000 [IU] | Freq: Two times a day (BID) | SUBCUTANEOUS | Status: DC
  Administered 2020-12-01 – 2020-12-06 (×12): 12 [IU] via SUBCUTANEOUS
  Filled 2020-12-01 (×14): qty 0.12

## 2020-12-01 MED ORDER — PRISMASOL BGK 4/2.5 32-4-2.5 MEQ/L REPLACEMENT SOLN
Status: DC
  Filled 2020-12-01 (×15): qty 5000

## 2020-12-01 MED ORDER — HEPARIN BOLUS VIA INFUSION (CRRT)
1000.0000 [IU] | INTRAVENOUS | Status: DC | PRN
  Administered 2020-12-01: 1000 [IU] via INTRAVENOUS_CENTRAL
  Filled 2020-12-01: qty 1000

## 2020-12-01 MED ORDER — INSULIN ASPART 100 UNIT/ML ~~LOC~~ SOLN
4.0000 [IU] | SUBCUTANEOUS | Status: DC
  Administered 2020-12-01 – 2020-12-07 (×35): 4 [IU] via SUBCUTANEOUS

## 2020-12-01 MED ORDER — PRISMASOL BGK 4/2.5 32-4-2.5 MEQ/L EC SOLN
Status: DC
  Filled 2020-12-01 (×57): qty 5000

## 2020-12-01 MED ORDER — HEPARIN (PORCINE) 2000 UNITS/L FOR CRRT: INTRAVENOUS_CENTRAL | Status: DC | PRN

## 2020-12-01 MED ORDER — SODIUM CHLORIDE 0.9 % IV SOLN
250.0000 [IU]/h | INTRAVENOUS | Status: DC
  Administered 2020-12-01: 250 [IU]/h via INTRAVENOUS_CENTRAL
  Administered 2020-12-01: 1100 [IU]/h via INTRAVENOUS_CENTRAL
  Administered 2020-12-02 – 2020-12-04 (×7): 1150 [IU]/h via INTRAVENOUS_CENTRAL
  Administered 2020-12-05 – 2020-12-07 (×8): 1300 [IU]/h via INTRAVENOUS_CENTRAL
  Filled 2020-12-01 (×18): qty 2

## 2020-12-01 NOTE — Progress Notes (Signed)
Patient ID: Abigail [name], female   DOB: March 03, 1961, 59 y.o.   MRN: [id number] [facility] Progress Note   Assessment/ Plan:   1. Acute kidney Injury: This appears to be multifactorial ATN from septic shock and recent PEA arrest.  Oliguric and without significant response to furosemide trial yesterday.  With worsening azotemia and persistent metabolic acidosis, would favor initiation of CRRT at this time.  Will discuss with CCM. 2.  Metabolic acidosis: Secondary to acute kidney injury/shock with lactic acidosis.  Poor response to sodium bicarbonate supplementation/fluids and will undertake CRRT. 3.  Critical illness anemia: Without overt blood loss, continue to monitor H/H trend to decide on need for PRBC transfusion. 4.  COVID-19 pneumonia/septic shock: On CAP coverage with azithromycin/ceftriaxone.  On corticosteroids and Barcitinib for management of COVID-19  Subjective:   Minimal response to furosemide challenge yesterday.   Objective:   BP 117/63   Pulse 76   Temp (!) 97.3 F (36.3 C)   Resp (!) 26   Ht 5' 2"  (1.575 m)   Wt 68.7 kg   SpO2 96%   BMI 27.70 kg/m   Intake/Output Summary (Last 24 hours) at 12/01/2020 6294 Last data filed at 12/01/2020 0500 Gross per 24 hour  Intake 1128.98 ml  Output 235 ml  Net 893.98 ml   Weight change: 1.9 kg  Physical Exam: Gen: Intubated, sedated, appears comfortable CVS: Pulse regular rhythm, normal rate, S1 and S2 normal Resp: Anteriorly clear to auscultation, no distinct rales or rhonchi Abd: Soft, flat, nontender Ext: Trace dependent edema  Imaging: CT HEAD WO CONTRAST  Result Date: 11/29/2020 CLINICAL DATA:  Anoxic brain damage EXAM: CT HEAD WITHOUT CONTRAST TECHNIQUE: Contiguous axial images were obtained from the base of the skull through the vertex without intravenous contrast. COMPARISON:  None. FINDINGS: Brain: No evidence of acute infarction, hemorrhage, hydrocephalus, extra-axial collection or mass  lesion/mass effect. Vascular: No hyperdense vessel or unexpected calcification. Skull: No osseous abnormality. Sinuses/Orbits: Visualized paranasal sinuses are clear. Visualized mastoid sinuses are clear. Visualized orbits demonstrate no focal abnormality. Other: None IMPRESSION: No acute intracranial pathology. Electronically Signed   By: Kathreen Devoid   On: 11/29/2020 13:41   DG Chest Port 1 View  Result Date: 12/01/2020 CLINICAL DATA:  ARDS due to COVID EXAM: PORTABLE CHEST 1 VIEW COMPARISON:  11/30/2020 FINDINGS: Support devices are stable. Severe diffuse bilateral airspace disease, worsening since prior study. Heart is borderline in size. No visible effusions or pneumothorax. IMPRESSION: Worsening airspace disease bilaterally. Electronically Signed   By: Rolm Baptise M.D.   On: 12/01/2020 03:20   DG Chest Port 1 View  Result Date: 11/30/2020 CLINICAL DATA:  Respiratory distress syndrome.  COVID. EXAM: PORTABLE CHEST 1 VIEW COMPARISON:  CT chest 12/09/2020.  Chest x-ray 11/28/2020. FINDINGS: Endotracheal tube tip 1 cm above the lower portion of the carina. Proximal repositioning of approximately 2 cm suggested. NG tube repositioned with tip over the stomach on today's exam. Heart size normal. Low lung volumes with bibasilar atelectasis. Diffuse bilateral pulmonary infiltrates/edema. No prominent pleural effusion. No pneumothorax. IMPRESSION: 1. Endotracheal tube tip 1 cm above the lower portion of the carina. Proximal repositioning of approximately 2 cm suggested. 2. NG tube repositioned with tip over the stomach on today's exam. 3. Low lung volumes with bibasilar atelectasis. Diffuse bilateral pulmonary infiltrates/edema. Electronically Signed   By: Marcello Moores  Register   On: 11/30/2020 05:28    Labs: BMET Recent Labs  Lab 12/12/2020 1527 12/04/2020 1936 11/28/20 0309 11/28/20 7654 11/28/20  0801 11/28/20 1317 11/28/20 1349 11/28/20 1640 11/29/20 0518 11/30/20 0449 11/30/20 1030 11/30/20 1421  12/01/20 0446  NA 129* 136   < > 134* 136 137  --   --  140 140 138 139 140  K 5.3* 4.5   < > 4.4 4.5 4.3  --   --  4.9 4.6 4.7 4.7 4.7  CL 94* 104  --  104  --   --   --   --  109 109  --   --  107  CO2 17* 9*  --  17*  --   --   --   --  17* 15*  --   --  16*  GLUCOSE 385* 495*  --  246*  --   --   --   --  159* 258*  --   --  183*  BUN 44* 44*  --  44*  --   --   --   --  59* 89*  --   --  113*  CREATININE 1.36* 1.43*  --  1.75*  --   --   --   --  2.63* 3.83*  --   --  4.53*  CALCIUM 8.2* 7.3*  --  7.7*  --   --   --   --  7.3* 7.2*  --   --  7.0*  PHOS  --   --   --   --   --   --  4.2 4.2 6.1* 7.6*  --   --   --    < > = values in this interval not displayed.   CBC Recent Labs  Lab 12/17/2020 1527 11/28/20 0309 11/28/20 0428 11/28/20 0801 11/29/20 0518 11/30/20 0449 11/30/20 1030 11/30/20 1421 12/01/20 0446  WBC 6.2  --  2.2*  --  5.7 3.9*  --   --  3.8*  NEUTROABS 5.1  --   --   --   --   --   --   --   --   HGB 12.0   < > 10.3*   < > 10.3* 8.9* 7.5* 7.5* 7.9*  HCT 36.7   < > 30.2*   < > 31.3* 28.0* 22.0* 22.0* 23.8*  MCV 95.3  --  93.2  --  94.3 96.9  --   --  95.2  PLT 329  --  229  --  266 181  --   --  119*   < > = values in this interval not displayed.    Medications:    . aspirin  81 mg Per Tube Daily  . baricitinib  1 mg Per Tube Daily  . chlorhexidine gluconate (MEDLINE KIT)  15 mL Mouth Rinse BID  . Chlorhexidine Gluconate Cloth  6 each Topical Daily  . docusate  100 mg Per Tube BID  . feeding supplement (PROSource TF)  45 mL Per Tube TID  . heparin injection (subcutaneous)  5,000 Units Subcutaneous Q8H  . levothyroxine  88 mcg Per Tube Daily  . mouth rinse  15 mL Mouth Rinse 10 times per day  . pantoprazole sodium  40 mg Per Tube Q24H  . polyethylene glycol  17 g Per Tube Daily  . predniSONE  50 mg Per Tube Daily  . rosuvastatin  20 mg Per Tube QHS  . sodium bicarbonate  1,300 mg Per Tube TID   Elmarie Shiley, MD 12/01/2020, 8:19 AM

## 2020-12-01 NOTE — Progress Notes (Addendum)
NAME:  Abigail [name], MRN:  [id number], DOB:  12/02/1961, LOS: 4 ADMISSION DATE:  11/23/2020, CONSULTATION DATE:  11/20/2020 REFERRING MD:  [facility], CHIEF COMPLAINT:  Hypoxic respiratory failure  Brief History   59 yo female former smoker with dyspnea and fatigue for 2 weeks prior to admission.  Her husband tested positive for COVID 19 infection.  In ER she had hypoxia with SpO2 in 70's and lactic acidosis.  She then developed PEA cardiac arrest from respiratory failure, ROSC after 8 minutes, and required intubation.  Past Medical History  DM Uterine cancer Bipolar HLD NSTEMI  Significant Hospital Events   12/11 Admit, PEA in ER 12/13 d/c heparin gtt 12/14 renal function worse, nephrology consulted.  Started Lasix, albumin trial  Consults:  Palliative care Nephrology  Procedures:  ETT 12/11 >> Aline 12/11 >>  Significant Diagnostic Tests:   CTA 12/11 >> No PE, bilateral dense GGO with dense consolidation in the lower lobes bilaterally  Echo 12/12 >> EF 35 to 40%, grade 2 DD, RVSP 39 mmHg  EEG 12/12 >> severe cerebral dysfunction with burst suppression  CT head 12/13 >> no acute pathology  Micro Data:  12/11 COVID >> Positive 12/11 blood cultures >> 12/11 trach aspirate >> 12/12 MRSA PCR >> negative  Antimicrobials:  Ceftriaxone 12/11 > Azithromycin 12/11 >  Interim history/subjective:   Nephrology now on board.  They have started Lasix, albumin trial  Objective   Blood pressure 117/63, pulse 76, temperature 98.96 F (37.2 C), resp. rate (!) 26, height 5\' 2"  (1.575 m), weight 68.7 kg, SpO2 96 %.    Vent Mode: PRVC FiO2 (%):  [80 %-100 %] 100 % Set Rate:  [28 bmp] 28 bmp Vt Set:  [400 mL] 400 mL PEEP:  [14 cmH20] 14 cmH20 Plateau Pressure:  [30 cmH20] 30 cmH20   Intake/Output Summary (Last 24 hours) at 12/01/2020 0753 Last data filed at 12/01/2020 0500 Gross per 24 hour  Intake 1317.14 ml  Output 235 ml  Net 1082.14 ml   Filed Weights    11/29/20 0600 11/30/20 0452 12/01/20 0402  Weight: 65.1 kg 66.8 kg 68.7 kg   Physical Exam: Blood pressure 123/64, pulse 77, temperature 97.88 F (36.6 C), resp. rate (!) 28, height 5\' 2"  (1.575 m), weight 68.7 kg, SpO2 93 %. Gen:      No acute distress HEENT:  EOMI, sclera anicteric Neck:     No masses; no thyromegaly, ETT Lungs:    Clear to auscultation bilaterally; normal respiratory effort CV:         Regular rate and rhythm; no murmurs Abd:      + bowel sounds; soft, non-tender; no palpable masses, no distension Ext:    No edema; adequate peripheral perfusion Skin:      Warm and dry; no rash Neuro: Sedated  Labs/imaging reviewed.   Significant for BUN/creatinine 113/4.53 WBC 3.8, hemoglobin 7.9, platelets 119 Chest x-ray with worsened airspace disease   Resolved Hospital Problem list   Elevated transaminases likely due to sepsis.  Assessment & Plan:   Acute hypoxic respiratory failure from COVID 19 pneumonia with ARDS. Continue full vent support with ARDS protocol Wean down PEEP/FiO2 as tolerated Goal plateau pressure less than 30, driving pressure less than 15 Continue baricitinib, steroids, day 5 Antibiotics day 5/7 for community-acquired pneumonia.  PEA from respiratory failure. Acute systolic/diastolic CHF. Hx of CAD, HLD. Will need cardiology assessment at some point On Crestor, aspirin  Septic shock from COVID infection. Wean pressors as tolerated  AKI from ATN. Non gap metabolic acidosis. Worsening renal function with minimal urine output Nephrology on board Likely headed towards CRRT.  Acute metabolic encephalopathy from hypoxia, sepsis, renal failure. - CT head 12/13 unremarkable - RASS goal -1 to -2  DM type 2 poorly controlled with steroid induced hyperglycemia. Continue insulin drip  Hx of hypothyroidism. Continue Synthroid  Anemia of critical illness. Monitor labs, transfuse for hemoglobin less than 7  Goals of care. Palliative care on  board.  Remains full code Discussed with husband Rica Mote over telephone who is very tearful. Daughter Vicente Males updated as well.   Best practice  Diet: tube feeds DVT prophylaxis: SQ heparin GI prophylaxis: pantoprazole Mobility: bedrest Code Status: full Disposition: ICU  Critical care time:    The patient is critically ill with multiple organ system failure and requires high complexity decision making for assessment and support, frequent evaluation and titration of therapies, advanced monitoring, review of radiographic studies and interpretation of complex data.   Critical Care Time devoted to patient care services, exclusive of separately billable procedures, described in this note is 45 minutes.   Marshell Garfinkel MD [facility] Please see [email or web address] for pager details.  12/01/2020, 8:05 AM

## 2020-12-01 NOTE — Plan of Care (Signed)
Problem: Education: Goal: Knowledge of General Education information will improve Description: Including pain rating scale, medication(s)/side effects and non-pharmacologic comfort measures Outcome: Progressing   Problem: Health Behavior/Discharge Planning: Goal: Ability to manage health-related needs will improve Outcome: Progressing   Problem: Clinical Measurements: Goal: Ability to maintain clinical measurements within normal limits will improve Outcome: Progressing Goal: Will remain free from infection Outcome: Progressing Goal: Diagnostic test results will improve Outcome: Progressing Goal: Respiratory complications will improve Outcome: Progressing Goal: Cardiovascular complication will be avoided Outcome: Progressing   Problem: Activity: Goal: Risk for activity intolerance will decrease Outcome: Progressing   Problem: Nutrition: Goal: Adequate nutrition will be maintained Outcome: Progressing   Problem: Coping: Goal: Level of anxiety will decrease Outcome: Progressing   Problem: Elimination: Goal: Will not experience complications related to bowel motility Outcome: Progressing Goal: Will not experience complications related to urinary retention Outcome: Progressing   Problem: Pain Managment: Goal: General experience of comfort will improve Outcome: Progressing   Problem: Safety: Goal: Ability to remain free from injury will improve Outcome: Progressing   Problem: Skin Integrity: Goal: Risk for impaired skin integrity will decrease Outcome: Progressing   Problem: Education: Goal: Knowledge of disease and its progression will improve Outcome: Progressing   Problem: Health Behavior/Discharge Planning: Goal: Ability to manage health-related needs will improve Outcome: Progressing   Problem: Clinical Measurements: Goal: Complications related to the disease process or treatment will be avoided or minimized Outcome: Progressing Goal: Dialysis access  will remain free of complications Outcome: Progressing   Problem: Activity: Goal: Activity intolerance will improve Outcome: Progressing   Problem: Fluid Volume: Goal: Fluid volume balance will be maintained or improved Outcome: Progressing   Problem: Nutritional: Goal: Ability to make appropriate dietary choices will improve Outcome: Progressing   Problem: Respiratory: Goal: Respiratory symptoms related to disease process will be avoided Outcome: Progressing   Problem: Self-Concept: Goal: Body image disturbance will be avoided or minimized Outcome: Progressing   Problem: Urinary Elimination: Goal: Progression of disease will be identified and treated Outcome: Progressing   Problem: Education: Goal: Knowledge of risk factors and measures for prevention of condition will improve Outcome: Progressing   Problem: Coping: Goal: Psychosocial and spiritual needs will be supported Outcome: Progressing   Problem: Respiratory: Goal: Will maintain a patent airway Outcome: Progressing Goal: Complications related to the disease process, condition or treatment will be avoided or minimized Outcome: Progressing   Problem: Education: Goal: Ability to describe self-care measures that may prevent or decrease complications (Diabetes Survival Skills Education) will improve Outcome: Progressing Goal: Individualized Educational Video(s) Outcome: Progressing   Problem: Coping: Goal: Ability to adjust to condition or change in health will improve Outcome: Progressing   Problem: Fluid Volume: Goal: Ability to maintain a balanced intake and output will improve Outcome: Progressing   Problem: Health Behavior/Discharge Planning: Goal: Ability to identify and utilize available resources and services will improve Outcome: Progressing Goal: Ability to manage health-related needs will improve Outcome: Progressing   Problem: Metabolic: Goal: Ability to maintain appropriate glucose levels  will improve Outcome: Progressing   Problem: Nutritional: Goal: Maintenance of adequate nutrition will improve Outcome: Progressing Goal: Progress toward achieving an optimal weight will improve Outcome: Progressing   Problem: Skin Integrity: Goal: Risk for impaired skin integrity will decrease Outcome: Progressing   Problem: Tissue Perfusion: Goal: Adequacy of tissue perfusion will improve Outcome: Progressing   Problem: Education: Goal: Ability to describe self-care measures that may prevent or decrease complications (Diabetes Survival Skills Education) will improve Outcome: Progressing Goal: Individualized  Educational Video(s) Outcome: Progressing   Problem: Cardiac: Goal: Ability to maintain an adequate cardiac output will improve Outcome: Progressing   Problem: Health Behavior/Discharge Planning: Goal: Ability to identify and utilize available resources and services will improve Outcome: Progressing Goal: Ability to manage health-related needs will improve Outcome: Progressing   Problem: Fluid Volume: Goal: Ability to achieve a balanced intake and output will improve Outcome: Progressing   Problem: Metabolic: Goal: Ability to maintain appropriate glucose levels will improve Outcome: Progressing   Problem: Nutritional: Goal: Maintenance of adequate nutrition will improve Outcome: Progressing Goal: Maintenance of adequate weight for body size and type will improve Outcome: Progressing   Problem: Respiratory: Goal: Will regain and/or maintain adequate ventilation Outcome: Progressing   Problem: Urinary Elimination: Goal: Ability to achieve and maintain adequate renal perfusion and functioning will improve Outcome: Progressing

## 2020-12-01 NOTE — Procedures (Signed)
Central Venous Catheter Insertion Procedure Note  Abigail [name]  [id number]  03-May-1961  Date:12/01/20  Time:10:31 AM   Provider Performing:Pete Johnette Abraham Kary Kos   Procedure: Insertion of Non-tunneled Central Venous Catheter(36556)with US guidance (11735)    Indication(s) Hemodialysis  Consent Risks of the procedure as well as the alternatives and risks of each were explained to the patient and/or caregiver.  Consent for the procedure was obtained and is signed in the bedside chart  Anesthesia Topical only with 1% lidocaine   Timeout Verified patient identification, verified procedure, site/side was marked, verified correct patient position, special equipment/implants available, medications/allergies/relevant history reviewed, required imaging and test results available.  Sterile Technique Maximal sterile technique including full sterile barrier drape, hand hygiene, sterile gown, sterile gloves, mask, hair covering, sterile ultrasound probe cover (if used).  Procedure Description Area of catheter insertion was cleaned with chlorhexidine and draped in sterile fashion.   With real-time ultrasound guidance a HD catheter was placed into the left internal jugular vein.  Nonpulsatile blood flow and easy flushing noted in all ports.  The catheter was sutured in place and sterile dressing applied.  Complications/Tolerance had some difficulty advancing dilator. Had to make larger incision necessitating pursed string suture around cath to address oozing at insertion site  Chest X-ray is ordered to verify placement for internal jugular or subclavian cannulation.  Chest x-ray is not ordered for femoral cannulation.  EBL 20 ml.   Specimen(s) None  Erick Colace ACNP-BC [place] Pager # [phone] OR # [phone] if no answer

## 2020-12-02 ENCOUNTER — Inpatient Hospital Stay (HOSPITAL_COMMUNITY): Payer: BLUE CROSS/BLUE SHIELD

## 2020-12-02 LAB — POCT I-STAT EG7
Acid-base deficit: 1 mmol/L (ref 0.0–2.0)
Bicarbonate: 25.8 mmol/L (ref 20.0–28.0)
Calcium, Ion: 1.06 mmol/L — ABNORMAL LOW (ref 1.15–1.40)
HCT: 25 % — ABNORMAL LOW (ref 36.0–46.0)
Hemoglobin: 8.5 g/dL — ABNORMAL LOW (ref 12.0–15.0)
O2 Saturation: 53 %
Patient temperature: 36.4
Potassium: 4.5 mmol/L (ref 3.5–5.1)
Sodium: 138 mmol/L (ref 135–145)
TCO2: 27 mmol/L (ref 22–32)
pCO2, Ven: 51.4 mmHg (ref 44.0–60.0)
pH, Ven: 7.306 (ref 7.250–7.430)
pO2, Ven: 30 mmHg — CL (ref 32.0–45.0)

## 2020-12-02 LAB — RENAL FUNCTION PANEL
Albumin: 2 g/dL — ABNORMAL LOW (ref 3.5–5.0)
Albumin: 2 g/dL — ABNORMAL LOW (ref 3.5–5.0)
Anion gap: 13 (ref 5–15)
Anion gap: 12 (ref 5–15)
BUN: 58 mg/dL — ABNORMAL HIGH (ref 6–20)
BUN: 37 mg/dL — ABNORMAL HIGH (ref 6–20)
CO2: 20 mmol/L — ABNORMAL LOW (ref 22–32)
CO2: 24 mmol/L (ref 22–32)
Calcium: 7 mg/dL — ABNORMAL LOW (ref 8.9–10.3)
Calcium: 7.5 mg/dL — ABNORMAL LOW (ref 8.9–10.3)
Chloride: 104 mmol/L (ref 98–111)
Chloride: 101 mmol/L (ref 98–111)
Creatinine, Ser: 2.02 mg/dL — ABNORMAL HIGH (ref 0.44–1.00)
Creatinine, Ser: 1.36 mg/dL — ABNORMAL HIGH (ref 0.44–1.00)
GFR, Estimated: 28 mL/min — ABNORMAL LOW (ref >60)
GFR, Estimated: 45 mL/min — ABNORMAL LOW (ref >60)
Glucose, Bld: 193 mg/dL — ABNORMAL HIGH (ref 70–99)
Glucose, Bld: 169 mg/dL — ABNORMAL HIGH (ref 70–99)
Phosphorus: 3.9 mg/dL (ref 2.5–4.6)
Phosphorus: 3.3 mg/dL (ref 2.5–4.6)
Potassium: 4.5 mmol/L (ref 3.5–5.1)
Potassium: 4.9 mmol/L (ref 3.5–5.1)
Sodium: 137 mmol/L (ref 135–145)
Sodium: 137 mmol/L (ref 135–145)

## 2020-12-02 LAB — MAGNESIUM: Magnesium: 2.5 mg/dL — ABNORMAL HIGH (ref 1.7–2.4)

## 2020-12-02 LAB — GLUCOSE, CAPILLARY
Glucose-Capillary: 183 mg/dL — ABNORMAL HIGH (ref 70–99)
Glucose-Capillary: 132 mg/dL — ABNORMAL HIGH (ref 70–99)
Glucose-Capillary: 74 mg/dL (ref 70–99)
Glucose-Capillary: 138 mg/dL — ABNORMAL HIGH (ref 70–99)
Glucose-Capillary: 181 mg/dL — ABNORMAL HIGH (ref 70–99)

## 2020-12-02 LAB — POCT ACTIVATED CLOTTING TIME
Activated Clotting Time: 184 seconds
Activated Clotting Time: 190 seconds
Activated Clotting Time: 190 seconds
Activated Clotting Time: 190 seconds
Activated Clotting Time: 196 seconds
Activated Clotting Time: 202 seconds
Activated Clotting Time: 202 seconds
Activated Clotting Time: 202 seconds
Activated Clotting Time: 208 seconds

## 2020-12-02 LAB — CBC
HCT: 23.2 % — ABNORMAL LOW (ref 36.0–46.0)
Hemoglobin: 7.6 g/dL — ABNORMAL LOW (ref 12.0–15.0)
MCH: 30.6 pg (ref 26.0–34.0)
MCHC: 32.8 g/dL (ref 30.0–36.0)
MCV: 93.5 fL (ref 80.0–100.0)
Platelets: 118 10*3/uL — ABNORMAL LOW (ref 150–400)
RBC: 2.48 MIL/uL — ABNORMAL LOW (ref 3.87–5.11)
RDW: 15.9 % — ABNORMAL HIGH (ref 11.5–15.5)
WBC: 3.5 10*3/uL — ABNORMAL LOW (ref 4.0–10.5)
nRBC: 15.7 % — ABNORMAL HIGH (ref 0.0–0.2)

## 2020-12-02 LAB — APTT: aPTT: 155 seconds — ABNORMAL HIGH (ref 24–36)

## 2020-12-02 MED ORDER — VITAL AF 1.2 CAL PO LIQD
1000.0000 mL | ORAL | Status: DC
  Administered 2020-12-03 – 2020-12-06 (×4): 1000 mL

## 2020-12-02 MED ORDER — PROSOURCE TF PO LIQD
45.0000 mL | Freq: Two times a day (BID) | ORAL | Status: DC
  Administered 2020-12-02 – 2020-12-07 (×10): 45 mL
  Filled 2020-12-02 (×10): qty 45

## 2020-12-02 MED ORDER — ROSUVASTATIN CALCIUM 5 MG PO TABS
10.0000 mg | ORAL_TABLET | Freq: Every day | ORAL | Status: DC
  Administered 2020-12-02 – 2020-12-06 (×5): 10 mg
  Filled 2020-12-02 (×5): qty 2

## 2020-12-02 MED ORDER — B COMPLEX-C PO TABS
1.0000 | ORAL_TABLET | Freq: Every day | ORAL | Status: DC
  Administered 2020-12-02 – 2020-12-07 (×6): 1
  Filled 2020-12-02 (×6): qty 1

## 2020-12-02 NOTE — Progress Notes (Signed)
Nutrition Follow-up  DOCUMENTATION CODES:   Not applicable  INTERVENTION:   Tube Feeding via OG: Vital AF 1.2 at 50 ml/hr Pro-Source 45 mL BID Provides 112 g of protein, 1520 kcals, 972 mL of free water  TF regimen and propofol at current rate providing 1900 total kcal/day   Add B-complex with C   NUTRITION DIAGNOSIS:   Increased nutrient needs related to acute illness (COVID) as evidenced by estimated needs.  Being addressed via TF   GOAL:   Patient will meet greater than or equal to 90% of their needs  Progressing  MONITOR:   Vent status,TF tolerance,Labs  REASON FOR ASSESSMENT:   Consult Enteral/tube feeding initiation and management  ASSESSMENT:   59 yo female admitted with COVID symptoms, PEA arrest in ED requiring intubation. Tested positive for COVID. PMH includes DM, bipolar disorder, HLD, NSTEMI, CAD.  12/11 Admit, PEA arrest, Intubated 12/15 CRRT initiated  Pt remains on vent support, requiring CRRT Propofol: 14.4 ml/hr  Vital AF 1.2 at 35 ml/hr with Pro-Stat 45 mL TID via OG  Admit weight 60 kg  Labs: CBG 74-183, Creatinine 2.02, BUN 58 Meds: colace, ss novolog, novolog q 4 hours, levemir, prednisone, sodium bicarb, miralax  Diet Order:   Diet Order            Diet NPO time specified  Diet effective now                 EDUCATION NEEDS:   Not appropriate for education at this time  Skin:  Skin Assessment: Reviewed RN Assessment  Last BM:  12/13  Height:   Ht Readings from Last 1 Encounters:  11/28/20 5\' 2"  (1.575 m)    Weight:   Wt Readings from Last 1 Encounters:  12/02/20 66.5 kg    Ideal Body Weight:  50 kg  BMI:  Body mass index is 26.81 kg/m.  Estimated Nutritional Needs:   Kcal:  1700-1900 kcals  Protein:  95-125 g  Fluid:  >/= 1.8 L    Kerman Passey MS, RDN, LDN, CNSC Registered Dietitian III Clinical Nutrition RD Pager and On-Call Pager Number Located in [place]

## 2020-12-02 NOTE — Plan of Care (Signed)
Problem: Education: Goal: Knowledge of General Education information will improve Description: Including pain rating scale, medication(s)/side effects and non-pharmacologic comfort measures Outcome: Progressing   Problem: Health Behavior/Discharge Planning: Goal: Ability to manage health-related needs will improve Outcome: Progressing   Problem: Clinical Measurements: Goal: Ability to maintain clinical measurements within normal limits will improve Outcome: Progressing Goal: Will remain free from infection Outcome: Progressing Goal: Diagnostic test results will improve Outcome: Progressing Goal: Respiratory complications will improve Outcome: Progressing Goal: Cardiovascular complication will be avoided Outcome: Progressing   Problem: Activity: Goal: Risk for activity intolerance will decrease Outcome: Progressing   Problem: Nutrition: Goal: Adequate nutrition will be maintained Outcome: Progressing   Problem: Coping: Goal: Level of anxiety will decrease Outcome: Progressing   Problem: Elimination: Goal: Will not experience complications related to bowel motility Outcome: Progressing Goal: Will not experience complications related to urinary retention Outcome: Progressing   Problem: Pain Managment: Goal: General experience of comfort will improve Outcome: Progressing   Problem: Safety: Goal: Ability to remain free from injury will improve Outcome: Progressing   Problem: Skin Integrity: Goal: Risk for impaired skin integrity will decrease Outcome: Progressing   Problem: Education: Goal: Knowledge of disease and its progression will improve Outcome: Progressing   Problem: Health Behavior/Discharge Planning: Goal: Ability to manage health-related needs will improve Outcome: Progressing   Problem: Clinical Measurements: Goal: Complications related to the disease process or treatment will be avoided or minimized Outcome: Progressing Goal: Dialysis access  will remain free of complications Outcome: Progressing   Problem: Activity: Goal: Activity intolerance will improve Outcome: Progressing   Problem: Fluid Volume: Goal: Fluid volume balance will be maintained or improved Outcome: Progressing   Problem: Nutritional: Goal: Ability to make appropriate dietary choices will improve Outcome: Progressing   Problem: Respiratory: Goal: Respiratory symptoms related to disease process will be avoided Outcome: Progressing   Problem: Self-Concept: Goal: Body image disturbance will be avoided or minimized Outcome: Progressing   Problem: Urinary Elimination: Goal: Progression of disease will be identified and treated Outcome: Progressing   Problem: Education: Goal: Knowledge of risk factors and measures for prevention of condition will improve Outcome: Progressing   Problem: Coping: Goal: Psychosocial and spiritual needs will be supported Outcome: Progressing   Problem: Respiratory: Goal: Will maintain a patent airway Outcome: Progressing Goal: Complications related to the disease process, condition or treatment will be avoided or minimized Outcome: Progressing   Problem: Fluid Volume: Goal: Ability to maintain a balanced intake and output will improve Outcome: Progressing   Problem: Metabolic: Goal: Ability to maintain appropriate glucose levels will improve Outcome: Progressing   Problem: Nutritional: Goal: Maintenance of adequate nutrition will improve Outcome: Progressing Goal: Progress toward achieving an optimal weight will improve Outcome: Progressing   Problem: Skin Integrity: Goal: Risk for impaired skin integrity will decrease Outcome: Progressing   Problem: Tissue Perfusion: Goal: Adequacy of tissue perfusion will improve Outcome: Progressing   Problem: Cardiac: Goal: Ability to maintain an adequate cardiac output will improve Outcome: Progressing   Problem: Health Behavior/Discharge Planning: Goal:  Ability to identify and utilize available resources and services will improve Outcome: Progressing Goal: Ability to manage health-related needs will improve Outcome: Progressing   Problem: Fluid Volume: Goal: Ability to achieve a balanced intake and output will improve Outcome: Progressing   Problem: Metabolic: Goal: Ability to maintain appropriate glucose levels will improve Outcome: Progressing   Problem: Nutritional: Goal: Maintenance of adequate nutrition will improve Outcome: Progressing Goal: Maintenance of adequate weight for body size and type will improve  Outcome: Progressing   Problem: Respiratory: Goal: Will regain and/or maintain adequate ventilation Outcome: Progressing   Problem: Urinary Elimination: Goal: Ability to achieve and maintain adequate renal perfusion and functioning will improve Outcome: Progressing

## 2020-12-02 NOTE — Progress Notes (Signed)
Patient ID: Abigail [name], female   DOB: March 24, 1961, 59 y.o.   MRN: [id number] [facility] Progress Note   Assessment/ Plan:   1. Acute kidney Injury: This appears to be multifactorial ATN from septic shock and recent PEA arrest.  Anuric overnight and previously without any response to furosemide challenge.  I will continue CRRT at this time for management of multiple metabolic abnormalities and volume control. 2.  Metabolic acidosis: Secondary to acute kidney injury/shock with lactic acidosis.  Improving with CRRT-now off sodium bicarbonate supplementation. 3.  Critical illness anemia: Without overt blood loss, continue to monitor H/H trend to decide on need for PRBC transfusion. 4.  COVID-19 pneumonia/septic shock: On CAP coverage with azithromycin/ceftriaxone.  On corticosteroids and Barcitinib for management of COVID-19  Subjective:   Without acute events overnight, tolerating CRRT without problems.   Objective:   BP (!) 115/58   Pulse 72   Temp 97.7 F (36.5 C)   Resp (!) 28   Ht 5' 2" (1.575 m)   Wt 66.5 kg   SpO2 95%   BMI 26.81 kg/m   Intake/Output Summary (Last 24 hours) at 12/02/2020 0908 Last data filed at 12/02/2020 0900 Gross per 24 hour  Intake 2122.55 ml  Output 3229 ml  Net -1106.45 ml   Weight change: -2.2 kg  Physical Exam: Gen: Intubated, sedated, appears comfortable, on CRRT CVS: Pulse regular rhythm, normal rate, S1 and S2 normal Resp: Anteriorly clear to auscultation, no distinct rales or rhonchi Abd: Soft, flat, nontender Ext: Trace dependent edema  Imaging: DG Chest Port 1 View  Result Date: 12/02/2020 CLINICAL DATA:  Respiratory failure. EXAM: PORTABLE CHEST 1 VIEW COMPARISON:  12/01/2020 FINDINGS: 0435 hours. Endotracheal tube tip is 1.6 cm above the base of the carina. The NG tube passes into the stomach although the distal tip position is not included on the film. Left IJ central line tip is positioned over the inferior  right atrium/IVC. Diffuse airspace disease again noted. The visualized bony structures of the thorax show no acute abnormality. Telemetry leads overlie the chest. IMPRESSION: 1. Stable exam.  Similar appearance diffuse airspace disease. Electronically Signed   By: Misty Stanley M.D.   On: 12/02/2020 07:01   DG CHEST PORT 1 VIEW  Result Date: 12/01/2020 CLINICAL DATA:  Central line placement EXAM: PORTABLE CHEST 1 VIEW COMPARISON:  12/01/2020 FINDINGS: New left IJ approach central line tip overlies the right atrium. Endotracheal tube is 1 cm above the carina as before. Enteric tube enters the stomach with tip out of field of view. No pneumothorax. Persistent but improved bilateral pulmonary opacities. No significant pleural effusion. Stable cardiomediastinal contours. IMPRESSION: New left IJ approach central line with tip overlying the right atrium. No pneumothorax. Endotracheal tube remains 1 cm above the carina. Persistent but improved bilateral pulmonary opacities. Electronically Signed   By: Macy Mis M.D.   On: 12/01/2020 11:18   DG Chest Port 1 View  Result Date: 12/01/2020 CLINICAL DATA:  ARDS due to COVID EXAM: PORTABLE CHEST 1 VIEW COMPARISON:  11/30/2020 FINDINGS: Support devices are stable. Severe diffuse bilateral airspace disease, worsening since prior study. Heart is borderline in size. No visible effusions or pneumothorax. IMPRESSION: Worsening airspace disease bilaterally. Electronically Signed   By: Rolm Baptise M.D.   On: 12/01/2020 03:20    Labs: BMET Recent Labs  Lab 12/15/2020 1936 11/28/20 0309 11/28/20 0428 11/28/20 0801 11/28/20 1349 11/28/20 1640 11/29/20 0518 11/30/20 0449 11/30/20 1030 11/30/20 1421 12/01/20 0446 12/01/20 1630 12/02/20  9563 12/02/20 0310  NA 136   < > 134*   < >  --   --  140 140 138 139 140 140 138 137  K 4.5   < > 4.4   < >  --   --  4.9 4.6 4.7 4.7 4.7 4.8 4.5 4.5  CL 104  --  104  --   --   --  109 109  --   --  107 106  --  104  CO2  9*  --  17*  --   --   --  17* 15*  --   --  16* 18*  --  20*  GLUCOSE 495*  --  246*  --   --   --  159* 258*  --   --  183* 203*  --  193*  BUN 44*  --  44*  --   --   --  59* 89*  --   --  113* 91*  --  58*  CREATININE 1.43*  --  1.75*  --   --   --  2.63* 3.83*  --   --  4.53* 3.49*  --  2.02*  CALCIUM 7.3*  --  7.7*  --   --   --  7.3* 7.2*  --   --  7.0* 7.1*  --  7.0*  PHOS  --   --   --   --  4.2 4.2 6.1* 7.6*  --   --   --  6.5*  --  3.9   < > = values in this interval not displayed.   CBC Recent Labs  Lab 12/06/2020 1527 11/28/20 0309 11/29/20 0518 11/30/20 0449 11/30/20 1030 11/30/20 1421 12/01/20 0446 12/02/20 0213 12/02/20 0310  WBC 6.2   < > 5.7 3.9*  --   --  3.8*  --  3.5*  NEUTROABS 5.1  --   --   --   --   --   --   --   --   HGB 12.0   < > 10.3* 8.9*   < > 7.5* 7.9* 8.5* 7.6*  HCT 36.7   < > 31.3* 28.0*   < > 22.0* 23.8* 25.0* 23.2*  MCV 95.3   < > 94.3 96.9  --   --  95.2  --  93.5  PLT 329   < > 266 181  --   --  119*  --  118*   < > = values in this interval not displayed.    Medications:    . aspirin  81 mg Per Tube Daily  . baricitinib  2 mg Per Tube Daily  . chlorhexidine gluconate (MEDLINE KIT)  15 mL Mouth Rinse BID  . Chlorhexidine Gluconate Cloth  6 each Topical Daily  . docusate  100 mg Per Tube BID  . feeding supplement (PROSource TF)  45 mL Per Tube TID  . heparin injection (subcutaneous)  5,000 Units Subcutaneous Q8H  . insulin aspart  3-9 Units Subcutaneous Q4H  . insulin aspart  4 Units Subcutaneous Q4H  . insulin detemir  12 Units Subcutaneous Q12H  . levothyroxine  88 mcg Per Tube Daily  . mouth rinse  15 mL Mouth Rinse 10 times per day  . pantoprazole sodium  40 mg Per Tube Q24H  . polyethylene glycol  17 g Per Tube Daily  . predniSONE  50 mg Per Tube Daily  . rosuvastatin  10 mg Per Tube QHS  .  sodium bicarbonate  1,300 mg Per Tube TID   Elmarie Shiley, MD 12/02/2020, 9:08 AM

## 2020-12-02 NOTE — Progress Notes (Signed)
NAME:  Abigail [name], MRN:  [id number], DOB:  [birth year], LOS: 5 ADMISSION DATE:  12/06/2020, CONSULTATION DATE:  11/22/2020 REFERRING MD:  Messick-ED, CHIEF COMPLAINT:  Hypoxic respiratory failure  Brief History   59 yo female former smoker with dyspnea and fatigue for 2 weeks prior to admission.  Her husband tested positive for COVID 19 infection.  In ER she had hypoxia with SpO2 in 70's and lactic acidosis.  She then developed PEA cardiac arrest from respiratory failure, ROSC after 8 minutes, and required intubation.  Past Medical History  DM Uterine cancer Bipolar HLD NSTEMI  Significant Hospital Events   12/11 Admit, PEA in ER 12/13 d/c heparin gtt 12/14 renal function worse, nephrology consulted.  Started Lasix, albumin trial 12/15 Started CRRT  Consults:  Palliative care Nephrology  Procedures:  ETT 12/11 >> Aline 12/11 >> Rt HD cath 12/15 >>  Significant Diagnostic Tests:   CTA 12/11 >> No PE, bilateral dense GGO with dense consolidation in the lower lobes bilaterally  Echo 12/12 >> EF 35 to 40%, grade 2 DD, RVSP 39 mmHg  EEG 12/12 >> severe cerebral dysfunction with burst suppression  CT head 12/13 >> no acute pathology  Micro Data:  12/11 COVID >> Positive 12/11 blood cultures >> 12/11 trach aspirate >> 12/12 MRSA PCR >> negative  Antimicrobials:  Ceftriaxone 12/11 > Azithromycin 12/11 >  Interim history/subjective:   Started hemodialysis yesterday  Objective   Blood pressure (!) 115/58, pulse 72, temperature 97.7 F (36.5 C), resp. rate (!) 28, height 5\' 2"  (1.575 m), weight 66.5 kg, SpO2 95 %.    Vent Mode: PRVC FiO2 (%):  [70 %-80 %] 70 % Set Rate:  [28 bmp] 28 bmp Vt Set:  [400 mL] 400 mL PEEP:  [14 cmH20] 14 cmH20 Plateau Pressure:  [25 cmH20-32 cmH20] 32 cmH20   Intake/Output Summary (Last 24 hours) at 12/02/2020 0842 Last data filed at 12/02/2020 0800 Gross per 24 hour  Intake 2120.64 ml  Output 3124 ml  Net -1003.36 ml    Filed Weights   11/30/20 0452 12/01/20 0402 12/02/20 0500  Weight: 66.8 kg 68.7 kg 66.5 kg   Physical Exam: Blood pressure (!) 115/58, pulse 72, temperature 97.7 F (36.5 C), resp. rate (!) 28, height 5\' 2"  (1.575 m), weight 66.5 kg, SpO2 95 %. Gen:      No acute distress HEENT:  EOMI, sclera anicteric Neck:     No masses; no thyromegaly, ETT Lungs:    Clear to auscultation bilaterally; normal respiratory effort CV:         Regular rate and rhythm; no murmurs Abd:      + bowel sounds; soft, non-tender; no palpable masses, no distension Ext:    No edema; adequate peripheral perfusion Skin:      Warm and dry; no rash Neuro: sedated  Labs/imaging reviewed.   Significant for BUN/creatinine 58/2.02 WBC 3.5, hemoglobin 7.6, platelets 118  Chest x-ray today with stable bilateral airspace disease.  Resolved Hospital Problem list   Elevated transaminases likely due to sepsis.  Assessment & Plan:   Acute hypoxic respiratory failure from COVID 19 pneumonia with ARDS. Continue full vent support with ARDS protocol Wean down PEEP/FiO2 as tolerated Goal plateau pressure less than 30, driving pressure less than 15 Continue baricitinib, steroids, day 5 Antibiotics day 6/7 for community-acquired pneumonia.  PEA from respiratory failure. Acute systolic/diastolic CHF. Hx of CAD, HLD. Will need cardiology assessment at some point On Crestor, aspirin  Septic shock from [place]  infection. Wean pressors as tolerated  AKI from ATN. Non gap metabolic acidosis. On CRRT now  Acute metabolic encephalopathy from hypoxia, sepsis, renal failure. - CT head 12/13 unremarkable - RASS goal -1 to -2  DM type 2 poorly controlled with steroid induced hyperglycemia. Continue insulin drip  Hx of hypothyroidism. Continue Synthroid  Anemia of critical illness. Monitor labs, transfuse for hemoglobin less than 7  Goals of care. Palliative care on board.  Remains full code Discussed with husband  Abigail [name] over telephone who is very tearful. Daughter Abigail [name] updated as well.   Best practice  Diet: tube feeds DVT prophylaxis: SQ heparin GI prophylaxis: pantoprazole Mobility: bedrest Code Status: full Disposition: ICU  Critical care time:    The patient is critically ill with multiple organ system failure and requires high complexity decision making for assessment and support, frequent evaluation and titration of therapies, advanced monitoring, review of radiographic studies and interpretation of complex data.   Critical Care Time devoted to patient care services, exclusive of separately billable procedures, described in this note is 45 minutes.   Abigail Garfinkel MD [facility] Please see [email or web address] for pager details.  12/02/2020, 8:42 AM

## 2020-12-02 NOTE — Progress Notes (Signed)
EG7 cartridge used instead of ACT. Sample came from CRRT line. Retesting ACT.

## 2020-12-03 ENCOUNTER — Inpatient Hospital Stay (HOSPITAL_COMMUNITY): Payer: BLUE CROSS/BLUE SHIELD

## 2020-12-03 LAB — RENAL FUNCTION PANEL
Albumin: 1.9 g/dL — ABNORMAL LOW (ref 3.5–5.0)
Albumin: 1.9 g/dL — ABNORMAL LOW (ref 3.5–5.0)
Anion gap: 12 (ref 5–15)
Anion gap: 9 (ref 5–15)
BUN: 33 mg/dL — ABNORMAL HIGH (ref 6–20)
BUN: 27 mg/dL — ABNORMAL HIGH (ref 6–20)
CO2: 24 mmol/L (ref 22–32)
CO2: 25 mmol/L (ref 22–32)
Calcium: 7.4 mg/dL — ABNORMAL LOW (ref 8.9–10.3)
Calcium: 7.4 mg/dL — ABNORMAL LOW (ref 8.9–10.3)
Chloride: 100 mmol/L (ref 98–111)
Chloride: 100 mmol/L (ref 98–111)
Creatinine, Ser: 1.23 mg/dL — ABNORMAL HIGH (ref 0.44–1.00)
Creatinine, Ser: 1.04 mg/dL — ABNORMAL HIGH (ref 0.44–1.00)
GFR, Estimated: 51 mL/min — ABNORMAL LOW (ref >60)
GFR, Estimated: >60 mL/min (ref >60)
Glucose, Bld: 199 mg/dL — ABNORMAL HIGH (ref 70–99)
Glucose, Bld: 228 mg/dL — ABNORMAL HIGH (ref 70–99)
Phosphorus: 2.8 mg/dL (ref 2.5–4.6)
Phosphorus: 2.4 mg/dL — ABNORMAL LOW (ref 2.5–4.6)
Potassium: 4.8 mmol/L (ref 3.5–5.1)
Potassium: 5 mmol/L (ref 3.5–5.1)
Sodium: 136 mmol/L (ref 135–145)
Sodium: 134 mmol/L — ABNORMAL LOW (ref 135–145)

## 2020-12-03 LAB — PATHOLOGIST SMEAR REVIEW

## 2020-12-03 LAB — POCT ACTIVATED CLOTTING TIME
Activated Clotting Time: 196 seconds
Activated Clotting Time: 196 seconds
Activated Clotting Time: 196 seconds
Activated Clotting Time: 196 seconds
Activated Clotting Time: 201 seconds
Activated Clotting Time: 220 seconds

## 2020-12-03 LAB — POCT I-STAT 7, (LYTES, BLD GAS, ICA,H+H)
Acid-Base Excess: 1 mmol/L (ref 0.0–2.0)
Bicarbonate: 26.8 mmol/L (ref 20.0–28.0)
Calcium, Ion: 1.08 mmol/L — ABNORMAL LOW (ref 1.15–1.40)
HCT: 19 % — ABNORMAL LOW (ref 36.0–46.0)
Hemoglobin: 6.5 g/dL — CL (ref 12.0–15.0)
O2 Saturation: 97 %
Patient temperature: 98.9
Potassium: 4.8 mmol/L (ref 3.5–5.1)
Sodium: 135 mmol/L (ref 135–145)
TCO2: 28 mmol/L (ref 22–32)
pCO2 arterial: 49.2 mmHg — ABNORMAL HIGH (ref 32.0–48.0)
pH, Arterial: 7.345 — ABNORMAL LOW (ref 7.350–7.450)
pO2, Arterial: 93 mmHg (ref 83.0–108.0)

## 2020-12-03 LAB — CBC
HCT: 21.5 % — ABNORMAL LOW (ref 36.0–46.0)
Hemoglobin: 7.5 g/dL — ABNORMAL LOW (ref 12.0–15.0)
MCH: 31.9 pg (ref 26.0–34.0)
MCHC: 34.9 g/dL (ref 30.0–36.0)
MCV: 91.5 fL (ref 80.0–100.0)
Platelets: 136 10*3/uL — ABNORMAL LOW (ref 150–400)
RBC: 2.35 MIL/uL — ABNORMAL LOW (ref 3.87–5.11)
RDW: 15.3 % (ref 11.5–15.5)
WBC: 3.7 10*3/uL — ABNORMAL LOW (ref 4.0–10.5)
nRBC: 34.7 % — ABNORMAL HIGH (ref 0.0–0.2)

## 2020-12-03 LAB — GLUCOSE, CAPILLARY
Glucose-Capillary: 156 mg/dL — ABNORMAL HIGH (ref 70–99)
Glucose-Capillary: 166 mg/dL — ABNORMAL HIGH (ref 70–99)
Glucose-Capillary: 180 mg/dL — ABNORMAL HIGH (ref 70–99)
Glucose-Capillary: 185 mg/dL — ABNORMAL HIGH (ref 70–99)
Glucose-Capillary: 187 mg/dL — ABNORMAL HIGH (ref 70–99)
Glucose-Capillary: 190 mg/dL — ABNORMAL HIGH (ref 70–99)
Glucose-Capillary: 208 mg/dL — ABNORMAL HIGH (ref 70–99)

## 2020-12-03 LAB — CULTURE, BLOOD (ROUTINE X 2)
Culture: NO GROWTH
Culture: NO GROWTH

## 2020-12-03 LAB — TRIGLYCERIDES
Triglycerides: 206 mg/dL — ABNORMAL HIGH (ref <150)
Triglycerides: 157 mg/dL — ABNORMAL HIGH (ref <150)

## 2020-12-03 LAB — APTT: aPTT: 186 seconds (ref 24–36)

## 2020-12-03 LAB — DIC (DISSEMINATED INTRAVASCULAR COAGULATION)PANEL
D-Dimer, Quant: 13.24 ug/mL-FEU — ABNORMAL HIGH (ref 0.00–0.50)
Fibrinogen: 420 mg/dL (ref 210–475)
INR: 1.2 (ref 0.8–1.2)
Platelets: 142 10*3/uL — ABNORMAL LOW (ref 150–400)
Prothrombin Time: 15.1 seconds (ref 11.4–15.2)
Smear Review: NONE SEEN
aPTT: 200 seconds (ref 24–36)

## 2020-12-03 LAB — MAGNESIUM: Magnesium: 2.5 mg/dL — ABNORMAL HIGH (ref 1.7–2.4)

## 2020-12-03 LAB — PHOSPHORUS: Phosphorus: 2.8 mg/dL (ref 2.5–4.6)

## 2020-12-03 MED ORDER — PREDNISONE 20 MG PO TABS
40.0000 mg | ORAL_TABLET | Freq: Every day | ORAL | Status: DC
  Administered 2020-12-03 – 2020-12-07 (×5): 40 mg
  Filled 2020-12-03 (×4): qty 2

## 2020-12-03 NOTE — Progress Notes (Addendum)
NAME:  Abigail [name], MRN:  [id number], DOB:  1961/09/02, LOS: 6 ADMISSION DATE:  12/03/2020, CONSULTATION DATE:  11/25/2020 REFERRING MD:  Messick-ED, CHIEF COMPLAINT:  Hypoxic respiratory failure  Brief History   59 yo female former smoker with dyspnea and fatigue for 2 weeks prior to admission.  Her husband tested positive for COVID 19 infection.  In ER she had hypoxia with SpO2 in 70's and lactic acidosis.  She then developed PEA cardiac arrest from respiratory failure, ROSC after 8 minutes, and required intubation.  Past Medical History  DM Uterine cancer Bipolar HLD NSTEMI  Significant Hospital Events   12/11 Admit, PEA in ER 12/13 d/c heparin gtt 12/14 renal function worse, nephrology consulted.  Started Lasix, albumin trial 12/15 Started CRRT, off pressors  Consults:  Palliative care Nephrology  Procedures:  ETT 12/11 >> Aline 12/11 >> Rt HD cath 12/15 >>  Significant Diagnostic Tests:   CTA 12/11 >> No PE, bilateral dense GGO with dense consolidation in the lower lobes bilaterally  Echo 12/12 >> EF 35 to 40%, grade 2 DD, RVSP 39 mmHg  EEG 12/12 >> severe cerebral dysfunction with burst suppression  CT head 12/13 >> no acute pathology  Micro Data:  12/11 COVID >> Positive 12/11 blood cultures >> negative 12/12 MRSA PCR >> negative  Antimicrobials:  Ceftriaxone 12/11 > Azithromycin 12/11 >  Interim history/subjective:   Continues on CRRT, remains sedated on propofol and fentanyl  Objective   Blood pressure (!) 101/52, pulse 64, temperature (!) 95.9 F (35.5 C), temperature source Oral, resp. rate (!) 28, height 5\' 2"  (1.575 m), weight 68 kg, SpO2 99 %.    Vent Mode: PRVC FiO2 (%):  [65 %-80 %] 80 % Set Rate:  [28 bmp] 28 bmp Vt Set:  [400 mL] 400 mL PEEP:  [14 cmH20] 14 cmH20 Plateau Pressure:  [29 cmH20-32 cmH20] 29 cmH20   Intake/Output Summary (Last 24 hours) at 12/03/2020 0954 Last data filed at 12/03/2020 0900 Gross per 24 hour   Intake 1973.36 ml  Output 3238 ml  Net -1264.64 ml   Filed Weights   12/01/20 0402 12/02/20 0500 12/03/20 0500  Weight: 68.7 kg 66.5 kg 68 kg   Physical Exam: Gen:      No acute distress HEENT:  EOMI, sclera anicteric, ETT Neck:     No masses; no thyromegaly Lungs:    Clear to auscultation bilaterally; normal respiratory effort CV:         Regular rate and rhythm; no murmurs Abd:      + bowel sounds; soft, non-tender; no palpable masses, no distension Ext:    No edema; adequate peripheral perfusion Skin:      Warm and dry; no rash Neuro: Sedated  Labs/imaging reviewed.   Significant for BUN/creatinine 33/1.23 Hemoglobin 7.4, platelets 136 Chest x-ray today with improving lung aeration.   Resolved Hospital Problem list   Elevated transaminases likely due to sepsis. Septic shock from COVID infection.  Assessment & Plan:   Acute hypoxic respiratory failure from COVID 19 pneumonia with ARDS. Continue full vent support with ARDS protocol Wean down PEEP/FiO2 as tolerated Goal plateau pressure less than 30, driving pressure less than 15 Continue baricitinib day 6 Antibiotics day 7/7 for community-acquired pneumonia. Reduce prednisone to 40mg /day  PEA from respiratory failure. Acute systolic/diastolic CHF. Hx of CAD, HLD. Will need cardiology assessment when stable On Crestor, aspirin  AKI from ATN. Non gap metabolic acidosis. On CRRT now with gentle volume removal  Acute metabolic  encephalopathy from hypoxia, sepsis, renal failure. - CT head 12/13 unremarkable - Decrease sedation. If still unresponsive then consider MRI for anoxic injury after cardiac arrest  DM type 2 poorly controlled with steroid induced hyperglycemia. Levemir, tube feed coverage  Hx of hypothyroidism. Continue Synthroid  Anemia of critical illness. Monitor labs, transfuse for hemoglobin less than 7  Goals of care. Palliative care on board.  Remains full code Discussed with husband Rica Mote  over telephone who is very tearful. Daughter Vicente Males updated as well.   Best practice  Diet: tube feeds DVT prophylaxis: SQ heparin GI prophylaxis: pantoprazole Mobility: bedrest Code Status: full Disposition: ICU  Critical care time:    The patient is critically ill with multiple organ system failure and requires high complexity decision making for assessment and support, frequent evaluation and titration of therapies, advanced monitoring, review of radiographic studies and interpretation of complex data.   Critical Care Time devoted to patient care services, exclusive of separately billable procedures, described in this note is 45 minutes.   Marshell Garfinkel MD [facility] Please see [email or web address] for pager details.  12/03/2020, 9:54 AM

## 2020-12-03 NOTE — Progress Notes (Signed)
PCCM progress note  Called family and discussed with daughter Vicente Males Updated on clinical status 3 daughters had a chance to discuss and feel that the family does not want to put her through CPR or shocks in case of another cardiac arrest  Limited CODE STATUS order entered.  The patient is critically ill with multiple organ system failure and requires high complexity decision making for assessment and support, frequent evaluation and titration of therapies, advanced monitoring, review of radiographic studies and interpretation of complex data.   Critical Care Time devoted to patient care services, exclusive of separately billable procedures, described in this note is 35 minutes.   Marshell Garfinkel MD [facility] Please see [email or web address] for pager details.  12/03/2020, 4:01 PM

## 2020-12-03 NOTE — Progress Notes (Signed)
[facility] Progress Note Patient Name: Abigail [name] DOB: 11/11/1961 MRN: [id number]   Date of Service  12/03/2020  HPI/Events of Note  APTT 186, patient is not on a heparin infusion, the only Heparin she is receiving is through the CRRT protocol.  eICU Interventions  Will order a stat DIC panel to r/o DIC, and then go from there depending on the results.        Kerry Kass Emmely Bittinger 12/03/2020, 6:16 AM

## 2020-12-03 NOTE — Progress Notes (Signed)
10:25 Spoke with Shea Stakes( Pt's RN) regarding midline for lab draws. Explained that midlines arent guaranteed to give blood return so that isnt an indication to insert one.

## 2020-12-03 NOTE — Progress Notes (Signed)
Assisted tele visit to patient with family members.  Gianni Fuchs Anderson, RN   

## 2020-12-03 NOTE — Progress Notes (Signed)
Patient ID: Abigail [name], female   DOB: 27-Dec-1960, 59 y.o.   MRN: [id number] [facility] Progress Note   Assessment/ Plan:   1. Acute kidney Injury: This appears to be multifactorial ATN from septic shock and recent PEA arrest.  Anuric overnight and previously without any response to furosemide challenge.  Continue on CRRT at this time for management of multiple metabolic abnormalities and volume control. 2.  Metabolic acidosis: Secondary to acute kidney injury/shock with lactic acidosis.  Corrected with CRRT. 3.  Critical illness anemia: Without overt blood loss, continue to monitor H/H trend to decide on need for PRBC transfusion. 4.  COVID-19 pneumonia/septic shock: On CAP coverage with azithromycin/ceftriaxone.  On corticosteroids and Barcitinib for management of COVID-19  Subjective:   Without acute events overnight-no clinical indicators of progress, tolerating CRRT without problems.   Objective:   BP (!) 101/52 (BP Location: Right Arm)   Pulse 64   Temp (!) 95.9 F (35.5 C) (Oral)   Resp (!) 28   Ht 5' 2" (1.575 m)   Wt 68 kg   SpO2 99%   BMI 27.42 kg/m   Intake/Output Summary (Last 24 hours) at 12/03/2020 0851 Last data filed at 12/03/2020 0800 Gross per 24 hour  Intake 2209.78 ml  Output 3204 ml  Net -994.22 ml   Weight change: 1.5 kg  Physical Exam: Gen: Intubated, unresponsive even with decreased sedation, on CRRT CVS: Pulse regular rhythm, normal rate, S1 and S2 normal Resp: Anteriorly clear to auscultation, no distinct rales or rhonchi Abd: Soft, flat, nontender Ext: No lower extremity edema  Imaging: DG Chest Port 1 View  Result Date: 12/03/2020 CLINICAL DATA:  Acute respiratory failure EXAM: PORTABLE CHEST 1 VIEW COMPARISON:  12/02/2020 FINDINGS: Cardiac shadow is stable. Endotracheal tube, gastric catheter and left jugular temporary dialysis catheter are again seen and stable. Airspace opacities are again identified bilaterally but  slightly improved when compared with the prior exam. No pneumothorax or sizable effusion is seen. IMPRESSION: Slight improved aeration bilaterally. Electronically Signed   By: Inez Catalina M.D.   On: 12/03/2020 07:57   DG Chest Port 1 View  Result Date: 12/02/2020 CLINICAL DATA:  Respiratory failure. EXAM: PORTABLE CHEST 1 VIEW COMPARISON:  12/01/2020 FINDINGS: 0435 hours. Endotracheal tube tip is 1.6 cm above the base of the carina. The NG tube passes into the stomach although the distal tip position is not included on the film. Left IJ central line tip is positioned over the inferior right atrium/IVC. Diffuse airspace disease again noted. The visualized bony structures of the thorax show no acute abnormality. Telemetry leads overlie the chest. IMPRESSION: 1. Stable exam.  Similar appearance diffuse airspace disease. Electronically Signed   By: Misty Stanley M.D.   On: 12/02/2020 07:01   DG CHEST PORT 1 VIEW  Result Date: 12/01/2020 CLINICAL DATA:  Central line placement EXAM: PORTABLE CHEST 1 VIEW COMPARISON:  12/01/2020 FINDINGS: New left IJ approach central line tip overlies the right atrium. Endotracheal tube is 1 cm above the carina as before. Enteric tube enters the stomach with tip out of field of view. No pneumothorax. Persistent but improved bilateral pulmonary opacities. No significant pleural effusion. Stable cardiomediastinal contours. IMPRESSION: New left IJ approach central line with tip overlying the right atrium. No pneumothorax. Endotracheal tube remains 1 cm above the carina. Persistent but improved bilateral pulmonary opacities. Electronically Signed   By: Macy Mis M.D.   On: 12/01/2020 11:18    Labs: [employer]  11/28/20 1640 11/29/20 0518 11/30/20 0449 11/30/20 1030 12/01/20 0446 12/01/20 1630 12/02/20 0213 12/02/20 0310 12/02/20 1618 12/03/20 0333 12/03/20 0442  NA  --  140 140   < > 140 140 138 137 137 135 136  K  --  4.9 4.6   < > 4.7 4.8 4.5 4.5  4.9 4.8 4.8  CL  --  109 109  --  107 106  --  104 101  --  100  CO2  --  17* 15*  --  16* 18*  --  20* 24  --  24  GLUCOSE  --  159* 258*  --  183* 203*  --  193* 169*  --  199*  BUN  --  59* 89*  --  113* 91*  --  58* 37*  --  33*  CREATININE  --  2.63* 3.83*  --  4.53* 3.49*  --  2.02* 1.36*  --  1.23*  CALCIUM  --  7.3* 7.2*  --  7.0* 7.1*  --  7.0* 7.5*  --  7.4*  PHOS 4.2 6.1* 7.6*  --   --  6.5*  --  3.9 3.3  --  2.8  2.8   < > = values in this interval not displayed.   CBC Recent Labs  Lab 12/04/2020 1527 11/28/20 0309 11/30/20 0449 11/30/20 1030 12/01/20 0446 12/02/20 0213 12/02/20 0310 12/03/20 0333 12/03/20 0442 12/03/20 0621  WBC 6.2   < > 3.9*  --  3.8*  --  3.5*  --  3.7*  --   NEUTROABS 5.1  --   --   --   --   --   --   --   --   --   HGB 12.0   < > 8.9*   < > 7.9* 8.5* 7.6* 6.5* 7.5*  --   HCT 36.7   < > 28.0*   < > 23.8* 25.0* 23.2* 19.0* 21.5*  --   MCV 95.3   < > 96.9  --  95.2  --  93.5  --  91.5  --   PLT 329   < > 181  --  119*  --  118*  --  136* 142*   < > = values in this interval not displayed.    Medications:    . aspirin  81 mg Per Tube Daily  . B-complex with vitamin C  1 tablet Per Tube Daily  . baricitinib  2 mg Per Tube Daily  . chlorhexidine gluconate (MEDLINE KIT)  15 mL Mouth Rinse BID  . Chlorhexidine Gluconate Cloth  6 each Topical Daily  . docusate  100 mg Per Tube BID  . feeding supplement (PROSource TF)  45 mL Per Tube BID  . heparin injection (subcutaneous)  5,000 Units Subcutaneous Q8H  . insulin aspart  3-9 Units Subcutaneous Q4H  . insulin aspart  4 Units Subcutaneous Q4H  . insulin detemir  12 Units Subcutaneous Q12H  . levothyroxine  88 mcg Per Tube Daily  . mouth rinse  15 mL Mouth Rinse 10 times per day  . pantoprazole sodium  40 mg Per Tube Q24H  . polyethylene glycol  17 g Per Tube Daily  . predniSONE  50 mg Per Tube Daily  . rosuvastatin  10 mg Per Tube QHS  . sodium bicarbonate  1,300 mg Per Tube TID   Elmarie Shiley,  MD 12/03/2020, 8:51 AM

## 2020-12-04 DIAGNOSIS — G9341 Metabolic encephalopathy: Secondary | ICD-10-CM

## 2020-12-04 LAB — GLUCOSE, CAPILLARY
Glucose-Capillary: 143 mg/dL — ABNORMAL HIGH (ref 70–99)
Glucose-Capillary: 153 mg/dL — ABNORMAL HIGH (ref 70–99)
Glucose-Capillary: 153 mg/dL — ABNORMAL HIGH (ref 70–99)
Glucose-Capillary: 177 mg/dL — ABNORMAL HIGH (ref 70–99)
Glucose-Capillary: 203 mg/dL — ABNORMAL HIGH (ref 70–99)
Glucose-Capillary: 211 mg/dL — ABNORMAL HIGH (ref 70–99)

## 2020-12-04 LAB — RENAL FUNCTION PANEL
Albumin: 1.9 g/dL — ABNORMAL LOW (ref 3.5–5.0)
Albumin: 1.9 g/dL — ABNORMAL LOW (ref 3.5–5.0)
Anion gap: 10 (ref 5–15)
Anion gap: 11 (ref 5–15)
BUN: 26 mg/dL — ABNORMAL HIGH (ref 6–20)
BUN: 26 mg/dL — ABNORMAL HIGH (ref 6–20)
CO2: 26 mmol/L (ref 22–32)
CO2: 25 mmol/L (ref 22–32)
Calcium: 7.6 mg/dL — ABNORMAL LOW (ref 8.9–10.3)
Calcium: 7.8 mg/dL — ABNORMAL LOW (ref 8.9–10.3)
Chloride: 101 mmol/L (ref 98–111)
Chloride: 99 mmol/L (ref 98–111)
Creatinine, Ser: 1.09 mg/dL — ABNORMAL HIGH (ref 0.44–1.00)
Creatinine, Ser: 1.02 mg/dL — ABNORMAL HIGH (ref 0.44–1.00)
GFR, Estimated: 59 mL/min — ABNORMAL LOW (ref >60)
GFR, Estimated: >60 mL/min (ref >60)
Glucose, Bld: 198 mg/dL — ABNORMAL HIGH (ref 70–99)
Glucose, Bld: 223 mg/dL — ABNORMAL HIGH (ref 70–99)
Phosphorus: 2 mg/dL — ABNORMAL LOW (ref 2.5–4.6)
Phosphorus: 2.2 mg/dL — ABNORMAL LOW (ref 2.5–4.6)
Potassium: 4.9 mmol/L (ref 3.5–5.1)
Potassium: 5.1 mmol/L (ref 3.5–5.1)
Sodium: 137 mmol/L (ref 135–145)
Sodium: 135 mmol/L (ref 135–145)

## 2020-12-04 LAB — POCT I-STAT 7, (LYTES, BLD GAS, ICA,H+H)
Acid-Base Excess: 5 mmol/L — ABNORMAL HIGH (ref 0.0–2.0)
Bicarbonate: 29 mmol/L — ABNORMAL HIGH (ref 20.0–28.0)
Calcium, Ion: 1.11 mmol/L — ABNORMAL LOW (ref 1.15–1.40)
HCT: 20 % — ABNORMAL LOW (ref 36.0–46.0)
Hemoglobin: 6.8 g/dL — CL (ref 12.0–15.0)
O2 Saturation: 94 %
Patient temperature: 99.4
Potassium: 5.2 mmol/L — ABNORMAL HIGH (ref 3.5–5.1)
Sodium: 134 mmol/L — ABNORMAL LOW (ref 135–145)
TCO2: 30 mmol/L (ref 22–32)
pCO2 arterial: 40.8 mmHg (ref 32.0–48.0)
pH, Arterial: 7.462 — ABNORMAL HIGH (ref 7.350–7.450)
pO2, Arterial: 67 mmHg — ABNORMAL LOW (ref 83.0–108.0)

## 2020-12-04 LAB — CBC
HCT: 21.6 % — ABNORMAL LOW (ref 36.0–46.0)
Hemoglobin: 7.1 g/dL — ABNORMAL LOW (ref 12.0–15.0)
MCH: 30.9 pg (ref 26.0–34.0)
MCHC: 32.9 g/dL (ref 30.0–36.0)
MCV: 93.9 fL (ref 80.0–100.0)
Platelets: 214 10*3/uL (ref 150–400)
RBC: 2.3 MIL/uL — ABNORMAL LOW (ref 3.87–5.11)
RDW: 15.9 % — ABNORMAL HIGH (ref 11.5–15.5)
WBC: 8.4 10*3/uL (ref 4.0–10.5)
nRBC: 49.3 % — ABNORMAL HIGH (ref 0.0–0.2)

## 2020-12-04 LAB — POCT ACTIVATED CLOTTING TIME
Activated Clotting Time: 196 seconds
Activated Clotting Time: 190 seconds
Activated Clotting Time: 190 seconds
Activated Clotting Time: 196 seconds
Activated Clotting Time: 196 seconds
Activated Clotting Time: 184 seconds
Activated Clotting Time: 184 seconds
Activated Clotting Time: 184 seconds

## 2020-12-04 LAB — APTT: aPTT: 125 seconds — ABNORMAL HIGH (ref 24–36)

## 2020-12-04 LAB — MAGNESIUM: Magnesium: 2.5 mg/dL — ABNORMAL HIGH (ref 1.7–2.4)

## 2020-12-04 MED ORDER — OXYCODONE HCL 5 MG/5ML PO SOLN
5.0000 mg | ORAL | Status: DC
Start: 2020-12-04 — End: 2020-12-07
  Administered 2020-12-04 – 2020-12-07 (×18): 5 mg
  Filled 2020-12-04 (×18): qty 5

## 2020-12-04 NOTE — Progress Notes (Signed)
Assisted tele visit to patient with daughter.  Margean Korell Ann, RN  

## 2020-12-04 NOTE — Progress Notes (Signed)
NAME:  Abigail [name], MRN:  [id number], DOB:  07-09-1961, LOS: 7 ADMISSION DATE:  11/17/2020, CONSULTATION DATE:  12/06/2020 REFERRING MD:  Messick-ED, CHIEF COMPLAINT:  Hypoxic respiratory failure  Brief History   59 yo female former smoker with dyspnea and fatigue for 2 weeks prior to admission.  Her husband tested positive for COVID 19 infection.  In ER she had hypoxia with SpO2 in 70's and lactic acidosis.  She then developed PEA cardiac arrest from respiratory failure, ROSC after 8 minutes, and required intubation.  Past Medical History  DM Uterine cancer Bipolar HLD NSTEMI  Significant Hospital Events   12/11 Admit, PEA in ER 12/13 d/c heparin gtt 12/14 renal function worse, nephrology consulted.  Started Lasix, albumin trial 12/15 Started CRRT, off pressors  Consults:  Palliative care Nephrology  Procedures:  ETT 12/11 >> Aline 12/11 >> Rt HD cath 12/15 >>  Significant Diagnostic Tests:   CTA 12/11 >> No PE, bilateral dense GGO with dense consolidation in the lower lobes bilaterally  Echo 12/12 >> EF 35 to 40%, grade 2 DD, RVSP 39 mmHg  EEG 12/12 >> severe cerebral dysfunction with burst suppression  CT head 12/13 >> no acute pathology  Micro Data:  12/11 COVID >> Positive 12/11 blood cultures >> negative 12/12 MRSA PCR >> negative  Antimicrobials:  Ceftriaxone 12/11 > 12/17 Azithromycin 12/11 > 12/17  Interim history/subjective:   Continues on CRRT, net negative -140/hour. Deeply sedated.   Objective   Blood pressure 138/68, pulse 87, temperature 97.9 F (36.6 C), temperature source Axillary, resp. rate 18, height 5\' 2"  (1.575 m), weight 68 kg, SpO2 96 %.    Vent Mode: PRVC FiO2 (%):  [70 %-100 %] 70 % Set Rate:  [34 bmp] 34 bmp Vt Set:  [300 mL] 300 mL PEEP:  [14 cmH20] 14 cmH20 Plateau Pressure:  [27 cmH20-30 cmH20] 30 cmH20   Intake/Output Summary (Last 24 hours) at 12/04/2020 1209 Last data filed at 12/04/2020 1200 Gross per 24 hour   Intake 2507.4 ml  Output 3834 ml  Net -1326.6 ml   Filed Weights   12/01/20 0402 12/02/20 0500 12/03/20 0500  Weight: 68.7 kg 66.5 kg 68 kg   Physical Exam: Gen:     Critically ill.  HEENT:  EOMI, sclera anicteric, ETT Neck:     No masses; no thyromegaly Lungs:    Diminished, sounds of mechanical ventilation auscultated CV:         Regular rate and rhythm; no murmurs Abd:      + bowel sounds; soft, non-tender; no palpable masses, no distension Ext:    No edema; adequate peripheral perfusion Skin:      Warm and dry; no rash Neuro: Sedated, not responsive.   Labs/imaging reviewed.   Significant for BUN/creatinine 33/1.23 Hemoglobin 7.4, platelets 136 Chest x-ray today with improving lung aeration.   Resolved Hospital Problem list   Elevated transaminases likely due to sepsis. Septic shock from COVID infection.  Assessment & Plan:   Acute hypoxic respiratory failure from COVID 19 pneumonia with ARDS. Continue full vent support with ARDS protocol lung protective ventilation Wean down PEEP/FiO2 as tolerated Goal plateau pressure less than 30, driving pressure less than 15 Continue baricitinib day 7 Antibiotics day 7/7 for community-acquired pneumonia. Reduce prednisone to 40mg /day  PEA from respiratory failure. Acute systolic/diastolic CHF. Hx of CAD, HLD. Will need cardiology assessment when stable On Crestor, aspirin  AKI from ATN. Non gap metabolic acidosis. Continue CRRT now with gentle volume removal  Acute metabolic encephalopathy from hypoxia, sepsis, renal failure. - CT head 12/13 unremarkable - Decrease sedation. Will change oxycodone to scheduled so we can lighten gtt sedation.  - If still unresponsive then consider MRI for anoxic injury after cardiac arrest  DM type 2 poorly controlled with steroid induced hyperglycemia. Levemir, tube feed coverage  Hx of hypothyroidism. Continue Synthroid  Anemia of critical illness. Monitor labs, transfuse for  hemoglobin less than 7  Goals of care. Palliative care on board.  Patient is DNR  Best practice  Diet: tube feeds DVT prophylaxis: SQ heparin GI prophylaxis: pantoprazole Mobility: bedrest Code Status: DNR.  Disposition: ICU  Critical care time:    The patient is critically ill with multiple organ system failure and requires high complexity decision making for assessment and support, frequent evaluation and titration of therapies, advanced monitoring, review of radiographic studies and interpretation of complex data.   Critical Care Time devoted to patient care services, exclusive of separately billable procedures, described in this note is 46 minutes.   Lenice Llamas, MD Pulmonary and [place] Pager: [phone]

## 2020-12-04 NOTE — Progress Notes (Signed)
Patient ID: Abigail [name], female   DOB: May 12, 1961, 59 y.o.   MRN: [id number] [facility] Progress Note   Assessment/ Plan:   1. Acute kidney Injury: This appears to be multifactorial ATN from septic shock and recent PEA arrest.  Anuric overnight and without evidence of renal recovery to date.  Continue on CRRT at this time for management of multiple metabolic abnormalities and volume control. 2.  Metabolic acidosis: Secondary to acute kidney injury/shock with lactic acidosis.  Corrected with CRRT. 3.  Critical illness anemia: Without overt blood loss, continue to monitor H/H trend to decide on need for PRBC transfusion. 4.  COVID-19 pneumonia/septic shock: On CAP coverage with azithromycin/ceftriaxone.  On corticosteroids and Barcitinib for management of COVID-19  Subjective:   Without acute events overnight-no neurological improvement noted overnight.  Now partial code.   Objective:   BP (!) 114/58   Pulse 73   Temp 98.9 F (37.2 C) (Oral)   Resp (!) 23   Ht 5' 2"  (1.575 m)   Wt 68 kg   SpO2 99%   BMI 27.42 kg/m   Intake/Output Summary (Last 24 hours) at 12/04/2020 3086 Last data filed at 12/04/2020 0900 Gross per 24 hour  Intake 2429.06 ml  Output 3426 ml  Net -996.94 ml   Weight change:   Physical Exam: Gen: Intubated, unresponsive, on CRRT CVS: Pulse regular rhythm, normal rate, S1 and S2 normal Resp: Transmitted breath sounds bilaterally-on ventilator, no distinct rales or rhonchi Abd: Soft, flat, nontender Ext: No lower extremity edema  Imaging: DG Chest Port 1 View  Result Date: 12/03/2020 CLINICAL DATA:  Acute respiratory failure EXAM: PORTABLE CHEST 1 VIEW COMPARISON:  12/02/2020 FINDINGS: Cardiac shadow is stable. Endotracheal tube, gastric catheter and left jugular temporary dialysis catheter are again seen and stable. Airspace opacities are again identified bilaterally but slightly improved when compared with the prior exam. No  pneumothorax or sizable effusion is seen. IMPRESSION: Slight improved aeration bilaterally. Electronically Signed   By: Inez Catalina M.D.   On: 12/03/2020 07:57    Labs: BMET Recent Labs  Lab 11/30/20 0449 11/30/20 1030 12/01/20 0446 12/01/20 1630 12/02/20 0213 12/02/20 0310 12/02/20 1618 12/03/20 0333 12/03/20 0442 12/03/20 1623 12/04/20 0429  NA 140   < > 140 140 138 137 137 135 136 134* 137  K 4.6   < > 4.7 4.8 4.5 4.5 4.9 4.8 4.8 5.0 4.9  CL 109  --  107 106  --  104 101  --  100 100 101  CO2 15*  --  16* 18*  --  20* 24  --  24 25 26   GLUCOSE 258*  --  183* 203*  --  193* 169*  --  199* 228* 198*  BUN 89*  --  113* 91*  --  58* 37*  --  33* 27* 26*  CREATININE 3.83*  --  4.53* 3.49*  --  2.02* 1.36*  --  1.23* 1.04* 1.09*  CALCIUM 7.2*  --  7.0* 7.1*  --  7.0* 7.5*  --  7.4* 7.4* 7.6*  PHOS 7.6*  --   --  6.5*  --  3.9 3.3  --  2.8  2.8 2.4* 2.0*   < > = values in this interval not displayed.   CBC Recent Labs  Lab 11/23/2020 1527 11/28/20 0309 11/30/20 0449 11/30/20 1030 12/01/20 0446 12/02/20 0213 12/02/20 0310 12/03/20 0333 12/03/20 0442 12/03/20 0621  WBC 6.2   < > 3.9*  --  3.8*  --  3.5*  --  3.7*  --   NEUTROABS 5.1  --   --   --   --   --   --   --   --   --   HGB 12.0   < > 8.9*   < > 7.9* 8.5* 7.6* 6.5* 7.5*  --   HCT 36.7   < > 28.0*   < > 23.8* 25.0* 23.2* 19.0* 21.5*  --   MCV 95.3   < > 96.9  --  95.2  --  93.5  --  91.5  --   PLT 329   < > 181  --  119*  --  118*  --  136* 142*   < > = values in this interval not displayed.    Medications:    . aspirin  81 mg Per Tube Daily  . B-complex with vitamin C  1 tablet Per Tube Daily  . baricitinib  2 mg Per Tube Daily  . chlorhexidine gluconate (MEDLINE KIT)  15 mL Mouth Rinse BID  . Chlorhexidine Gluconate Cloth  6 each Topical Daily  . docusate  100 mg Per Tube BID  . feeding supplement (PROSource TF)  45 mL Per Tube BID  . heparin injection (subcutaneous)  5,000 Units Subcutaneous Q8H  .  insulin aspart  3-9 Units Subcutaneous Q4H  . insulin aspart  4 Units Subcutaneous Q4H  . insulin detemir  12 Units Subcutaneous Q12H  . levothyroxine  88 mcg Per Tube Daily  . mouth rinse  15 mL Mouth Rinse 10 times per day  . pantoprazole sodium  40 mg Per Tube Q24H  . polyethylene glycol  17 g Per Tube Daily  . predniSONE  40 mg Per Tube Daily  . rosuvastatin  10 mg Per Tube QHS  . sodium bicarbonate  1,300 mg Per Tube TID   Elmarie Shiley, MD 12/04/2020, 9:21 AM

## 2020-12-05 ENCOUNTER — Encounter (HOSPITAL_COMMUNITY): Payer: Self-pay | Admitting: Critical Care Medicine

## 2020-12-05 ENCOUNTER — Inpatient Hospital Stay (HOSPITAL_COMMUNITY): Payer: BLUE CROSS/BLUE SHIELD

## 2020-12-05 DIAGNOSIS — J9602 Acute respiratory failure with hypercapnia: Secondary | ICD-10-CM

## 2020-12-05 DIAGNOSIS — G931 Anoxic brain damage, not elsewhere classified: Secondary | ICD-10-CM

## 2020-12-05 LAB — RENAL FUNCTION PANEL
Albumin: 2 g/dL — ABNORMAL LOW (ref 3.5–5.0)
Albumin: 2 g/dL — ABNORMAL LOW (ref 3.5–5.0)
Anion gap: 9 (ref 5–15)
Anion gap: 12 (ref 5–15)
BUN: 27 mg/dL — ABNORMAL HIGH (ref 6–20)
BUN: 30 mg/dL — ABNORMAL HIGH (ref 6–20)
CO2: 27 mmol/L (ref 22–32)
CO2: 24 mmol/L (ref 22–32)
Calcium: 8.2 mg/dL — ABNORMAL LOW (ref 8.9–10.3)
Calcium: 7.9 mg/dL — ABNORMAL LOW (ref 8.9–10.3)
Chloride: 101 mmol/L (ref 98–111)
Chloride: 100 mmol/L (ref 98–111)
Creatinine, Ser: 0.93 mg/dL (ref 0.44–1.00)
Creatinine, Ser: 0.85 mg/dL (ref 0.44–1.00)
GFR, Estimated: >60 mL/min (ref >60)
GFR, Estimated: >60 mL/min (ref >60)
Glucose, Bld: 106 mg/dL — ABNORMAL HIGH (ref 70–99)
Glucose, Bld: 210 mg/dL — ABNORMAL HIGH (ref 70–99)
Phosphorus: 2.3 mg/dL — ABNORMAL LOW (ref 2.5–4.6)
Phosphorus: 3.2 mg/dL (ref 2.5–4.6)
Potassium: 4.7 mmol/L (ref 3.5–5.1)
Potassium: 4.9 mmol/L (ref 3.5–5.1)
Sodium: 137 mmol/L (ref 135–145)
Sodium: 136 mmol/L (ref 135–145)

## 2020-12-05 LAB — BLOOD GAS, ARTERIAL
Acid-Base Excess: 1 mmol/L (ref 0.0–2.0)
Bicarbonate: 25.6 mmol/L (ref 20.0–28.0)
FIO2: 70
O2 Saturation: 90.3 %
Patient temperature: 36.7
pCO2 arterial: 43.9 mmHg (ref 32.0–48.0)
pH, Arterial: 7.383 (ref 7.350–7.450)
pO2, Arterial: 61.5 mmHg — ABNORMAL LOW (ref 83.0–108.0)

## 2020-12-05 LAB — GLUCOSE, CAPILLARY
Glucose-Capillary: 104 mg/dL — ABNORMAL HIGH (ref 70–99)
Glucose-Capillary: 133 mg/dL — ABNORMAL HIGH (ref 70–99)
Glucose-Capillary: 151 mg/dL — ABNORMAL HIGH (ref 70–99)
Glucose-Capillary: 162 mg/dL — ABNORMAL HIGH (ref 70–99)
Glucose-Capillary: 164 mg/dL — ABNORMAL HIGH (ref 70–99)
Glucose-Capillary: 190 mg/dL — ABNORMAL HIGH (ref 70–99)

## 2020-12-05 LAB — MAGNESIUM: Magnesium: 2.7 mg/dL — ABNORMAL HIGH (ref 1.7–2.4)

## 2020-12-05 LAB — POCT ACTIVATED CLOTTING TIME
Activated Clotting Time: 190 seconds
Activated Clotting Time: 190 seconds
Activated Clotting Time: 196 seconds
Activated Clotting Time: 196 seconds
Activated Clotting Time: 196 seconds
Activated Clotting Time: 196 seconds
Activated Clotting Time: 207 seconds
Activated Clotting Time: 214 seconds

## 2020-12-05 LAB — APTT: aPTT: 121 seconds — ABNORMAL HIGH (ref 24–36)

## 2020-12-05 MED ORDER — HYDRALAZINE HCL 20 MG/ML IJ SOLN
10.0000 mg | INTRAMUSCULAR | Status: DC | PRN
  Administered 2020-12-05: 20 mg via INTRAVENOUS
  Filled 2020-12-05: qty 1

## 2020-12-05 MED ORDER — POTASSIUM & SODIUM PHOSPHATES 280-160-250 MG PO PACK
1.0000 | PACK | Freq: Three times a day (TID) | ORAL | Status: AC
  Administered 2020-12-05 – 2020-12-06 (×3): 1
  Filled 2020-12-05 (×3): qty 1

## 2020-12-05 NOTE — Progress Notes (Signed)
Transported Pt to and from MRI on Ventilator. Pt stable throughout with no complications.

## 2020-12-05 NOTE — Progress Notes (Signed)
NAME:  Abigail [name], MRN:  [id number], DOB:  02-12-1961, LOS: 8 ADMISSION DATE:  11/24/2020, CONSULTATION DATE:  11/18/2020 REFERRING MD:  Messick-ED, CHIEF COMPLAINT:  Hypoxic respiratory failure  Brief History   59 yo female former smoker with dyspnea and fatigue for 2 weeks prior to admission.  Her husband tested positive for COVID 19 infection.  In ER she had hypoxia with SpO2 in 70's and lactic acidosis.  She then developed PEA cardiac arrest from respiratory failure, ROSC after 8 minutes, and required intubation.  Past Medical History  DM Uterine cancer Bipolar HLD NSTEMI  Significant Hospital Events   12/11 Admit, PEA in ER 12/13 d/c heparin gtt 12/14 renal function worse, nephrology consulted.  Started Lasix, albumin trial 12/15 Started CRRT, off pressors  Consults:  Palliative care Nephrology  Procedures:  ETT 12/11 >> Aline 12/11 >> Rt HD cath 12/15 >>  Significant Diagnostic Tests:   CTA 12/11 >> No PE, bilateral dense GGO with dense consolidation in the lower lobes bilaterally  Echo 12/12 >> EF 35 to 40%, grade 2 DD, RVSP 39 mmHg  EEG 12/12 >> severe cerebral dysfunction with burst suppression  CT head 12/13 >> no acute pathology  Micro Data:  12/11 COVID >> Positive 12/11 blood cultures >> negative 12/12 MRSA PCR >> negative  Antimicrobials:  Ceftriaxone 12/11 > 12/17 Azithromycin 12/11 > 12/17  Interim history/subjective:  Still on CRRT. Had MRI this morning showing global ischemic injury and multiple infarcts.   Objective   Blood pressure (!) 144/62, pulse 91, temperature (!) 97.5 F (36.4 C), temperature source Axillary, resp. rate (!) 35, height 5\' 2"  (1.575 m), weight 68 kg, SpO2 91 %.    Vent Mode: PCV FiO2 (%):  [60 %-70 %] 70 % Set Rate:  [15 bmp] 15 bmp PEEP:  [12 cmH20] 12 cmH20 Plateau Pressure:  [23 cmH20-27 cmH20] 27 cmH20   Intake/Output Summary (Last 24 hours) at 12/05/2020 1351 Last data filed at 12/05/2020  1300 Gross per 24 hour  Intake 1622.02 ml  Output 2744 ml  Net -1121.98 ml   Filed Weights   12/01/20 0402 12/02/20 0500 12/03/20 0500  Weight: 68.7 kg 66.5 kg 68 kg   Physical Exam: Gen:     Critically ill.  HEENT:  EOMI, sclera anicteric, ETT Neck:     No masses; no thyromegaly Lungs:    Diminished, sounds of mechanical ventilation auscultated CV:         Regular rate and rhythm; no murmurs Abd:      + bowel sounds; soft, non-tender; no palpable masses, no distension Ext:    No edema; adequate peripheral perfusion Skin:      Warm and dry; no rash Neuro: Sedated, not responsive.   Labs/imaging reviewed.   Mri personally reviewed.    Resolved Hospital Problem list   Elevated transaminases likely due to sepsis. Septic shock from COVID infection.  Assessment & Plan:   Acute hypoxic respiratory failure from COVID 19 pneumonia with ARDS. Continue full vent support with ARDS protocol lung protective ventilation Wean down PEEP/FiO2 as tolerated Goal plateau pressure less than 30, driving pressure less than 15 Continue baricitinib day 7 Antibiotics day 7/7 for community-acquired pneumonia. Reduce prednisone to 40mg /day  Acute metabolic encephalopathy from hypoxia, sepsis, renal failure. - CT head 12/13 unremarkable. MRI 12/19 shows global ischemic insult with multiple acute strokes.  - Decrease sedation. Will change oxycodone to scheduled so we can lighten gtt sedation.  - If still unresponsive then consider  MRI for anoxic injury after cardiac arrest  PEA from respiratory failure. Acute systolic/diastolic CHF. Hx of CAD, HLD. Will need cardiology assessment when stable On Crestor, aspirin  AKI from ATN. Non gap metabolic acidosis. Continue CRRT now with gentle volume removal  DM type 2 poorly controlled with steroid induced hyperglycemia. Levemir, tube feed coverage  Hx of hypothyroidism. Continue Synthroid  Anemia of critical illness. Monitor labs, transfuse for  hemoglobin less than 7  Goals of care. Palliative care on board.  Patient is DNR  Best practice  Diet: tube feeds DVT prophylaxis: SQ heparin GI prophylaxis: pantoprazole Mobility: bedrest Code Status: DNR.  Disposition: ICU  Family update: discussed findings of MRI with daughter [name]. Introduced concept of comfort measures. She is going to speak with her sisters and call us back.   Critical care time:    The patient is critically ill with multiple organ systems failure and requires high complexity decision making for assessment and support, frequent evaluation and titration of therapies, application of advanced monitoring technologies and extensive interpretation of multiple databases.   Critical Care Time devoted to patient care services described in this note is 49 minutes. This time reflects time of care of this Hannah . This critical care time does not reflect separately billable procedures or procedure time, teaching time or supervisory time of PA/NP/Med student/Med Resident etc but could involve care discussion time.  Leone [facility] 12/05/2020 1:51 PM  Pager: [phone] After hours pager: [phone]

## 2020-12-05 NOTE — Progress Notes (Signed)
Patient ID: Abigail [name], female   DOB: 1961-03-20, 59 y.o.   MRN: [id number] [facility] Progress Note   Assessment/ Plan:   1. Acute kidney Injury: This appears to be multifactorial ATN from septic shock and recent PEA arrest.  Anuric overnight and without evidence of renal recovery to date.  Continue on CRRT at this time for management of multiple metabolic abnormalities and volume control. 2.  Metabolic acidosis: Secondary to acute kidney injury/shock with lactic acidosis.  Corrected with CRRT. 3.  Critical illness anemia: Without overt blood loss, continue to monitor H/H trend to decide on need for PRBC transfusion. 4.  COVID-19 pneumonia/septic shock: On CAP coverage with azithromycin/ceftriaxone.  On corticosteroids and Barcitinib for management of COVID-19 5.  Hypophosphatemia: Secondary to CRRT associated losses, replacement per CCM protocol.  Subjective:   Remains poorly responsive from a neurological standpoint; on route to get MRI of the head this morning   Objective:   BP (!) 163/61   Pulse 80   Temp (!) 97.5 F (36.4 C) (Axillary)   Resp (!) 30   Ht _0  (1.575 m)   Wt 68 kg   SpO2 94%   BMI 27.42 kg/m   Intake/Output Summary (Last 24 hours) at 12/05/2020 0928 Last data filed at 12/05/2020 0900 Gross per 24 hour  Intake 2115.41 ml  Output 3386 ml  Net -1270.59 ml   Weight change:   Physical Exam: Gen: Intubated, unresponsive, on CRRT CVS: Pulse regular rhythm, normal rate, S1 and S2 normal Resp: Transmitted breath sounds bilaterally-on ventilator, no distinct rales or rhonchi Abd: Soft, flat, nontender Ext: No lower extremity edema  Imaging: No results found.  Labs: BMET Recent Labs  Lab 12/02/20 0310 12/02/20 1618 12/03/20 0333 12/03/20 0442 12/03/20 1623 12/04/20 0429 12/04/20 1612 12/04/20 1621 12/05/20 0353  NA 137 137 135 136 134* 137 134* 135 137  K 4.5 4.9 4.8 4.8 5.0 4.9 5.2* 5.1 4.7  CL 104 101  --  100 100 101   --  99 101  CO2 20* 24  --  _1 --  25 27  GLUCOSE 193* 169*  --  199* 228* 198*  --  223* 106*  BUN 58* 37*  --  33* 27* 26*  --  26* 27*  CREATININE 2.02* 1.36*  --  1.23* 1.04* 1.09*  --  1.02* 0.93  CALCIUM 7.0* 7.5*  --  7.4* 7.4* 7.6*  --  7.8* 8.2*  PHOS 3.9 3.3  --  2.8  2.8 2.4* 2.0*  --  2.2* 2.3*   CBC Recent Labs  Lab 12/01/20 0446 12/02/20 0213 12/02/20 0310 12/03/20 0333 12/03/20 0442 12/03/20 0621 12/04/20 1612 12/04/20 1621  WBC 3.8*  --  3.5*  --  3.7*  --   --  8.4  HGB 7.9*   < > 7.6* 6.5* 7.5*  --  6.8* 7.1*  HCT 23.8*   < > 23.2* 19.0* 21.5*  --  20.0* 21.6*  MCV 95.2  --  93.5  --  91.5  --   --  93.9  PLT 119*  --  118*  --  136* 142*  --  214   < > = values in this interval not displayed.    Medications:    . aspirin  81 mg Per Tube Daily  . B-complex with vitamin C  1 tablet Per Tube Daily  . baricitinib  2 mg Per Tube Daily  . chlorhexidine gluconate (MEDLINE KIT)  15 mL Mouth Rinse BID  . Chlorhexidine Gluconate Cloth  6 each Topical Daily  . docusate  100 mg Per Tube BID  . feeding supplement (PROSource TF)  45 mL Per Tube BID  . heparin injection (subcutaneous)  5,000 Units Subcutaneous Q8H  . insulin aspart  3-9 Units Subcutaneous Q4H  . insulin aspart  4 Units Subcutaneous Q4H  . insulin detemir  12 Units Subcutaneous Q12H  . levothyroxine  88 mcg Per Tube Daily  . mouth rinse  15 mL Mouth Rinse 10 times per day  . oxyCODONE  5 mg Per Tube Q4H  . pantoprazole sodium  40 mg Per Tube Q24H  . polyethylene glycol  17 g Per Tube Daily  . predniSONE  40 mg Per Tube Daily  . rosuvastatin  10 mg Per Tube QHS  . sodium bicarbonate  1,300 mg Per Tube TID   Elmarie Shiley, MD 12/05/2020, 9:28 AM

## 2020-12-06 DIAGNOSIS — G9340 Encephalopathy, unspecified: Secondary | ICD-10-CM

## 2020-12-06 LAB — RENAL FUNCTION PANEL
Albumin: 2 g/dL — ABNORMAL LOW (ref 3.5–5.0)
Albumin: 1.9 g/dL — ABNORMAL LOW (ref 3.5–5.0)
Anion gap: 12 (ref 5–15)
Anion gap: 12 (ref 5–15)
BUN: 26 mg/dL — ABNORMAL HIGH (ref 6–20)
BUN: 27 mg/dL — ABNORMAL HIGH (ref 6–20)
CO2: 24 mmol/L (ref 22–32)
CO2: 24 mmol/L (ref 22–32)
Calcium: 8.3 mg/dL — ABNORMAL LOW (ref 8.9–10.3)
Calcium: 8.2 mg/dL — ABNORMAL LOW (ref 8.9–10.3)
Chloride: 101 mmol/L (ref 98–111)
Chloride: 99 mmol/L (ref 98–111)
Creatinine, Ser: 0.82 mg/dL (ref 0.44–1.00)
Creatinine, Ser: 0.86 mg/dL (ref 0.44–1.00)
GFR, Estimated: >60 mL/min (ref >60)
GFR, Estimated: >60 mL/min (ref >60)
Glucose, Bld: 140 mg/dL — ABNORMAL HIGH (ref 70–99)
Glucose, Bld: 188 mg/dL — ABNORMAL HIGH (ref 70–99)
Phosphorus: 2.9 mg/dL (ref 2.5–4.6)
Phosphorus: 2.9 mg/dL (ref 2.5–4.6)
Potassium: 5 mmol/L (ref 3.5–5.1)
Potassium: 5 mmol/L (ref 3.5–5.1)
Sodium: 137 mmol/L (ref 135–145)
Sodium: 135 mmol/L (ref 135–145)

## 2020-12-06 LAB — GLUCOSE, CAPILLARY
Glucose-Capillary: 112 mg/dL — ABNORMAL HIGH (ref 70–99)
Glucose-Capillary: 122 mg/dL — ABNORMAL HIGH (ref 70–99)
Glucose-Capillary: 122 mg/dL — ABNORMAL HIGH (ref 70–99)
Glucose-Capillary: 147 mg/dL — ABNORMAL HIGH (ref 70–99)
Glucose-Capillary: 216 mg/dL — ABNORMAL HIGH (ref 70–99)
Glucose-Capillary: 213 mg/dL — ABNORMAL HIGH (ref 70–99)

## 2020-12-06 LAB — POCT ACTIVATED CLOTTING TIME
Activated Clotting Time: 202 seconds
Activated Clotting Time: 208 seconds
Activated Clotting Time: 220 seconds
Activated Clotting Time: 220 seconds
Activated Clotting Time: 208 seconds
Activated Clotting Time: 219 seconds

## 2020-12-06 LAB — APTT: aPTT: 145 seconds — ABNORMAL HIGH (ref 24–36)

## 2020-12-06 LAB — MAGNESIUM: Magnesium: 2.6 mg/dL — ABNORMAL HIGH (ref 1.7–2.4)

## 2020-12-06 NOTE — Progress Notes (Signed)
Patient ID: THALYA FOUCHE, female   DOB: 11-07-1961, 59 y.o.   MRN: [id number] S: results of MRI noted O:BP (!) 145/58   Pulse 67   Temp 97.6 F (36.4 C) (Oral)   Resp 16   Ht _0  (1.575 m)   Wt 61.4 kg   SpO2 94%   BMI 24.76 kg/m   Intake/Output Summary (Last 24 hours) at 12/06/2020 1442 Last data filed at 12/06/2020 1400 Gross per 24 hour  Intake 2220.66 ml  Output 3503 ml  Net -1282.34 ml   Intake/Output: I/O last 3 completed shifts: In: 2617.3 [I.V.:577.3; NG/GT:2040] Out: 4569 [Other:4569]  Intake/Output this shift:  Total I/O In: 882.6 [I.V.:165.6; NG/GT:717] Out: 1177 [Other:1177] Weight change:  Physical exam: unable to complete due to COVID + status.  In order to preserve PPE equipment and to minimize exposure to providers.  Notes from other caregivers reviewed   Recent Labs  Lab 11/30/20 0449 11/30/20 1030 12/03/20 0442 12/03/20 1623 12/04/20 0429 12/04/20 1612 12/04/20 1621 12/05/20 0353 12/05/20 1606 12/06/20 0919  NA 140   < > 136 134* 137 134* 135 137 136 137  K 4.6   < > 4.8 5.0 4.9 5.2* 5.1 4.7 4.9 5.0  CL 109   < > 100 100 101  --  99 101 100 101  CO2 15*   < > _1 --  _2 GLUCOSE 258*   < > 199* 228* 198*  --  223* 106* 210* 140*  BUN 89*   < > 33* 27* 26*  --  26* 27* 30* 26*  CREATININE 3.83*   < > 1.23* 1.04* 1.09*  --  1.02* 0.93 0.85 0.82  ALBUMIN 1.6*   < > 1.9* 1.9* 1.9*  --  1.9* 2.0* 2.0* 2.0*  CALCIUM 7.2*   < > 7.4* 7.4* 7.6*  --  7.8* 8.2* 7.9* 8.3*  PHOS 7.6*   < > 2.8  2.8 2.4* 2.0*  --  2.2* 2.3* 3.2 2.9  AST 109*  --   --   --   --   --   --   --   --   --   ALT 136*  --   --   --   --   --   --   --   --   --    < > = values in this interval not displayed.   Liver Function Tests: Recent Labs  Lab 11/30/20 0449 12/01/20 1630 12/05/20 0353 12/05/20 1606 12/06/20 0919  AST 109*  --   --   --   --   ALT 136*  --   --   --   --   ALKPHOS 115  --   --   --   --   BILITOT 0.5  --   --   --   --    PROT 4.3*  --   --   --   --   ALBUMIN 1.6*   < > 2.0* 2.0* 2.0*   < > = values in this interval not displayed.   No results for input(s): LIPASE, AMYLASE in the last 168 hours. No results for input(s): AMMONIA in the last 168 hours. CBC: Recent Labs  Lab 11/30/20 0449 11/30/20 1030 12/01/20 0446 12/02/20 0213 12/02/20 0310 12/03/20 0333 12/03/20 0442 12/03/20 0621 12/04/20 1612 12/04/20 1621  WBC 3.9*  --  3.8*  --  3.5*  --  3.7*  --   --  8.4  HGB 8.9*   < > 7.9*   < > 7.6*   < > 7.5*  --  6.8* 7.1*  HCT 28.0*   < > 23.8*   < > 23.2*   < > 21.5*  --  20.0* 21.6*  MCV 96.9  --  95.2  --  93.5  --  91.5  --   --  93.9  PLT 181  --  119*  --  118*  --  136* 142*  --  214   < > = values in this interval not displayed.   Cardiac Enzymes: No results for input(s): CKTOTAL, CKMB, CKMBINDEX, TROPONINI in the last 168 hours. CBG: Recent Labs  Lab 12/05/20 1925 12/05/20 2347 12/06/20 0341 12/06/20 0746 12/06/20 1144  GLUCAP 164* 151* 112* 122* 122*    Iron Studies: No results for input(s): IRON, TIBC, TRANSFERRIN, FERRITIN in the last 72 hours. Studies/Results: MR BRAIN WO CONTRAST  Result Date: 12/05/2020 CLINICAL DATA:  Coronavirus infection. Cardiac arrest. Cephalopathy. Anoxic brain injury. EXAM: MRI HEAD WITHOUT CONTRAST TECHNIQUE: Multiplanar, multiecho pulse sequences of the brain and surrounding structures were obtained without intravenous contrast. COMPARISON:  11/29/2020 FINDINGS: Brain: There are scattered areas of acute infarction affecting both cerebellar hemispheres. No large confluent infarction. No posterior fossa swelling. No brainstem insult. There is confluent cortical infarctions throughout the left middle cerebral artery territory. Areas of infarction may be a differing ages, with older infarction present in the posterior temporal/parietal junction region with some swelling and petechial blood products. Probable hyperacute cortical infarction evident  elsewhere throughout the left MCA territory. Other scattered foci of acute infarction throughout both hemispheres either consistent with micro embolic infarctions or vasculitis related infarctions. Low level ischemic changes also present in the basal ganglia corpus striatum portions bilaterally. No midline shift. No hydrocephalus. No extra-axial collection. Vascular: Major vessels at the base of the brain show flow. Skull and upper cervical spine: Negative Sinuses/Orbits: Clear/normal Other: None IMPRESSION: Numerous scattered focal areas of acute infarction throughout both cerebral hemispheres and both cerebellar hemispheres. Confluent infarction of the cortex throughout the left MCA territory, with areas of infarction may be a differing ages, with acute but older infarction in the left posterior temporal/parietal junction region with some swelling and petechial blood products. Probable hyperacute cortical infarction elsewhere throughout the left MCA territory. Low level ischemic changes also present in the basal ganglia in the corpus striatum portions bilaterally. Etiology of the infarctions could be embolic or related to vasculitic occlusions. There could also obviously be an element of global hypoxic ischemic insult. Electronically Signed   By: Nelson Chimes M.D.   On: 12/05/2020 13:12   . aspirin  81 mg Per Tube Daily  . B-complex with vitamin C  1 tablet Per Tube Daily  . baricitinib  2 mg Per Tube Daily  . chlorhexidine gluconate (MEDLINE KIT)  15 mL Mouth Rinse BID  . Chlorhexidine Gluconate Cloth  6 each Topical Daily  . docusate  100 mg Per Tube BID  . feeding supplement (PROSource TF)  45 mL Per Tube BID  . insulin aspart  3-9 Units Subcutaneous Q4H  . insulin aspart  4 Units Subcutaneous Q4H  . insulin detemir  12 Units Subcutaneous Q12H  . levothyroxine  88 mcg Per Tube Daily  . mouth rinse  15 mL Mouth Rinse 10 times per day  . oxyCODONE  5 mg Per Tube Q4H  . pantoprazole sodium  40 mg  Per Tube Q24H  . polyethylene  glycol  17 g Per Tube Daily  . predniSONE  40 mg Per Tube Daily  . rosuvastatin  10 mg Per Tube QHS  . sodium bicarbonate  1,300 mg Per Tube TID    BMET    Component Value Date/Time   NA 137 12/06/2020 0919   K 5.0 12/06/2020 0919   CL 101 12/06/2020 0919   CO2 24 12/06/2020 0919   GLUCOSE 140 (H) 12/06/2020 0919   BUN 26 (H) 12/06/2020 0919   CREATININE 0.82 12/06/2020 0919   CREATININE 0.70 04/25/2016 1037   CALCIUM 8.3 (L) 12/06/2020 0919   GFRNONAA >60 12/06/2020 0919   GFRAA >60 04/16/2016 0508   CBC    Component Value Date/Time   WBC 8.4 12/04/2020 1621   RBC 2.30 (L) 12/04/2020 1621   HGB 7.1 (L) 12/04/2020 1621   HCT 21.6 (L) 12/04/2020 1621   PLT 214 12/04/2020 1621   MCV 93.9 12/04/2020 1621   MCH 30.9 12/04/2020 1621   MCHC 32.9 12/04/2020 1621   RDW 15.9 (H) 12/04/2020 1621   LYMPHSABS 0.7 12/13/2020 1527   MONOABS 0.4 12/03/2020 1527   EOSABS 0.0 11/19/2020 1527   BASOSABS 0.0 11/18/2020 1527     Assessment/Plan:  1. Oliguric AKI- multifactorial with ATN from septic shock and PEA arrest.  She remains anuric. 1. Started on CRRT 12/01/20 2. All fluids 4K/2.5Ca:  Pre-filter 500 ml/hr, post filter 400 ml/hr, dialysate 1500 ml/hr  3. UF rate 50-100 ml/hr 4. Heparin for anticoagulation 2. Metabolic acidosis- due to #1 3. Acute hypoxic respiratory failure with ARDS and sepsis- due to covid-19 pneumonia on azithromycin and ceftriaxone per PCCM.  Treated with baricitinib, prednisone. 4. Anemia of critical illness 5. Hypophosphatemia 6. Disposition- MRI reveals global ischemic insult with multiple acute strokes.  Dr. Chase Caller has discussed this with family and plan to transition to comfort care tomorrow.  Will continue with CRRT for another 24 hours then transition to comfort care and terminal wean.   Donetta Potts, MD [employer] [phone]

## 2020-12-06 NOTE — Progress Notes (Signed)
Tech attempted to reach RN to see if exam is still wanted as per Donato Heinz, MD note on 12/20 at 14:42pm, family will possibly be transitioning pt to comfort care tomorrow secretary said she would have RN to call back.

## 2020-12-06 NOTE — Plan of Care (Signed)
  Problem: Nutrition: Goal: Adequate nutrition will be maintained Outcome: Progressing   Problem: Activity: Goal: Risk for activity intolerance will decrease Outcome: Not Progressing   Problem: Skin Integrity: Goal: Risk for impaired skin integrity will decrease Outcome: Not Progressing

## 2020-12-06 NOTE — Progress Notes (Addendum)
NAME:  Abigail [name], MRN:  [id number], DOB:  [birth year], LOS: 9 ADMISSION DATE:  11/24/2020, CONSULTATION DATE:  12/15/2020 REFERRING MD:  [facility]ick-ED, CHIEF COMPLAINT:  Hypoxic respiratory failure  Brief History   59 yo female former smoker with dyspnea and fatigue for 2 weeks prior to admission.  Her husband tested positive for COVID 19 infection.  In ER she had hypoxia with SpO2 in 70's and lactic acidosis.  She then developed PEA cardiac arrest from respiratory failure, ROSC after 8 minutes, and required intubation.  Past Medical History  DM Uterine cancer Bipolar HLD NSTEMI  Significant Hospital Events   12/11 Admit, PEA in ER 12/13 d/c heparin gtt 12/14 renal function worse, nephrology consulted.  Started Lasix, albumin trial 12/15 Started CRRT, off pressors 12/19 - Continues on CRRT, net negative -140/hour. Deeply sedated.   Consults:  Palliative care Nephrology  Procedures:  ETT 12/11 >> Aline 12/11 >> Rt HD cath 12/15 >>  Significant Diagnostic Tests:   CTA 12/11 >> No PE, bilateral dense GGO with dense consolidation in the lower lobes bilaterally  Echo 12/12 >> EF 35 to 40%, grade 2 DD, RVSP 39 mmHg  EEG 12/12 >> severe cerebral dysfunction with burst suppression v/e hypoxemia  CT head 12/13 >> no acute pathology  Micro Data:  12/11 COVID >> Positive 12/11 blood cultures >> negative 12/12 MRSA PCR >> negative  Antimicrobials:  Ceftriaxone 12/11 > 12/17 Azithromycin 12/11 > 12/17  Interim history/subjective:   12/20 -remains on ventilator and CRRT.  MRI brain yesterday with evidence of hypoxemic ischemic injury.  Currently on fentanyl infusion,, scheduled oxycodone.  According to the bedside nurse: Family is requested a my chart review by one of their f friends who is a Dietitian before making a decision on terminal wean.   Currently full medical care but no CPR  Objective   Blood pressure (!) 122/47, pulse 66, temperature 97.6 F (36.4  C), temperature source Oral, resp. rate 18, height 5\' 2"  (1.575 m), weight 61.4 kg, SpO2 98 %.    Vent Mode: PCV FiO2 (%):  [70 %] 70 % Set Rate:  [15 bmp] 15 bmp PEEP:  [12 cmH20] 12 cmH20 Plateau Pressure:  [26 cmH20-28 cmH20] 26 cmH20   Intake/Output Summary (Last 24 hours) at 12/06/2020 8938 Last data filed at 12/06/2020 0900 Gross per 24 hour  Intake 1739.98 ml  Output 3079 ml  Net -1339.02 ml   Filed Weights   12/02/20 0500 12/03/20 0500 12/06/20 0458  Weight: 66.5 kg 68 kg 61.4 kg    General Appearance:  Looks criticall ill Head:  Normocephalic, without obvious abnormality, atraumatic Eyes:  PERRL - yes, conjunctiva/corneas - muddy     Ears:  Normal external ear canals, both ears Nose:  G tube - no Throat:  ETT TUBE - yes , OG tube - yes Neck:  Supple,  No enlargement/tenderness/nodules Lungs: Clear to auscultation bilaterally, Ventilator   Synchrony - yes Heart:  S1 and S2 normal, no murmur, CVP - no.  Pressors - no Abdomen:  Soft, no masses, no organomegaly Genitalia / Rectal:  Not done Extremities:  Extremities- intact Skin:  ntact in exposed areas . Sacral area - not examined Neurologic:  Sedation - fent gtt -> RASS - -2      Resolved Hospital Problem list   Elevated transaminases likely due to sepsis. Septic shock from COVID infection.  Assessment & Plan:   Acute hypoxic respiratory failure from COVID 19 pneumonia with ARDS.  12/06/2020 - > does not meet criteria for SBT/Extubation in setting of Acute Respiratory Failure due to acute encephalopahty, CRRT needs and sedation needs and 70% fio2 support  Plan Continue full vent support with ARDS protocol lung protective ventilation Wean down PEEP/FiO2 as tolerated Goal plateau pressure less than 30, driving pressure less than 15  COVID-19 wth s/p CAP Rx ending 12/17  plan Continue baricitinib day 8 (last day 12/25) Prednisone to 40mg /day  PEA from respiratory failure. Acute systolic/diastolic  CHF. Hx of CAD, HLD.  -  Appears euvolemic  plan cardiology assessment when stable and if c/w goals of care On Crestor, aspirin  AKI from ATN. Non gap metabolic acidosis.  12/06/2020  - on CRRT  plan Continue CRRT now with gentle volume removal  Acute metabolic encephalopathy from hypoxia, sepsis, renal failure. - CT head 12/13 unremarkable - EEG 12/12 and MRI 12/19 -c./w Anoxia  Plan  - sedation - repeat spot eeg 12/20   DM type 2 poorly controlled with steroid induced hyperglycemia. Levemir, tube feed coverage  Hx of hypothyroidism. Continue Synthroid  Anemia of critical illness.  12/06/2020 - no active bleed. hgb 7.1 ? Got PRBC 12/18  Plan - PRBC for hgb </= 6.9gm%    - exceptions are   -  if ACS susepcted/confirmed then transfuse for hgb </= 8.0gm%,  or    -  active bleeding with hemodynamic instability, then transfuse regardless of hemoglobin value   At at all times try to transfuse 1 unit prbc as possible with exception of active hemorrhage   Goals of care. Palliative care on board.  Patient is DNR  Best practice  Diet: tube feeds DVT prophylaxis: SQ heparin GI prophylaxis: pantoprazole Mobility: bedrest Code Status: DNR. But full medical care.  Disposition: ICU   GOALS of care      Famuily: dtr Lilyrose Tanney 12/20 - [phone] -> denied questions. Dtr is aware of MRI results c/w anoxia. Says today is Anna's 6 year old niece's birtday so they wanted to wait for 1 more day but visit 03-Jan-2021 before decisions on comfort. Per daughter family and they are in agreement for comfort care but want to do Jan 03, 2021. Daughter in tears as she is talking to MD. Explained comfort process  TERMINAL WEAN DISCUSSION  Explained concept of terminal wean  Explained how terminal wean works at the bedside Explained MD, RN, and RT role in this Explained that we are only stopping medicines and therapies  that are not effective and are only prolonging her  suffering Explained that vent and pressors are not consistent with her goals Explained that we are still caring for patient by providing care aimed at comfort, agony, pain, distress  Explained post terminal wean we allow nature to take course    Explain predicting death post terminal is unpredictable but in patient Abigail [name] with 12-27-1960 and [place] [id number] . However, likely to happen in order of seconds and minutes  Explained they can time the event based on personal needs and family/friends to gather  Explained that medicines used for comfort are morphine and benzo and explained the doctrine of double effect  They are appreciative and will time terminal wean later on 01-03-2021  after family gather     [place]   The patient Abigail [name] is critically ill with multiple organ systems failure and requires high complexity decision making for assessment and support, frequent evaluation and titration of therapies, application of  advanced monitoring technologies and extensive interpretation of multiple databases.   Critical Care Time devoted to patient care services described in this note is  45  Minutes. This time reflects time of care of this signee Dr Brand Males. This critical care time does not reflect procedure time, or teaching time or supervisory time of PA/NP/Med student/Med Resident etc but could involve care discussion time     Dr. Brand Males, M.D., [facility].C.P Pulmonary and Critical Care Medicine Staff Physician [place] [facility] Pager: [phone], If no answer or between  15:00h - 7:00h: call [phone]  12/06/2020 9:18 AM    LABS    PULMONARY Recent Labs  Lab 11/30/20 1030 11/30/20 1421 12/01/20 0511 12/02/20 0213 12/03/20 0333 12/04/20 1612 12/05/20 0449  PHART 7.271* 7.273* 7.233*  --  7.345* 7.462* 7.383  PCO2ART 36.1 36.6 37.3  --  49.2* 40.8 43.9   PO2ART 63* 99 140*  --  93 67* 61.5*  HCO3 16.7* 16.9* 15.2* 25.8 26.8 29.0* 25.6  TCO2 18* 18*  --  27 28 30   --   O2SAT 89.0 97.0 97.9 53.0 97.0 94.0 90.3    CBC Recent Labs  Lab 12/02/20 0310 12/03/20 0333 12/03/20 0442 12/03/20 0621 12/04/20 1612 12/04/20 1621  HGB 7.6*   < > 7.5*  --  6.8* 7.1*  HCT 23.2*   < > 21.5*  --  20.0* 21.6*  WBC 3.5*  --  3.7*  --   --  8.4  PLT 118*  --  136* 142*  --  214   < > = values in this interval not displayed.    COAGULATION Recent Labs  Lab 12/03/20 0621  INR 1.2    CARDIAC  No results for input(s): TROPONINI in the last 168 hours. No results for input(s): PROBNP in the last 168 hours.   CHEMISTRY Recent Labs  Lab 11/30/20 0449 11/30/20 1030 12/02/20 0310 12/02/20 1618 12/03/20 0442 12/03/20 1623 12/04/20 0429 12/04/20 1612 12/04/20 1621 12/05/20 0353 12/05/20 1606  NA 140   < > 137   < > 136 134* 137 134* 135 137 136  K 4.6   < > 4.5   < > 4.8 5.0 4.9 5.2* 5.1 4.7 4.9  CL 109   < > 104   < > 100 100 101  --  99 101 100  CO2 15*   < > 20*   < > 24 25 26   --  25 27 24   GLUCOSE 258*   < > 193*   < > 199* 228* 198*  --  223* 106* 210*  BUN 89*   < > 58*   < > 33* 27* 26*  --  26* 27* 30*  CREATININE 3.83*   < > 2.02*   < > 1.23* 1.04* 1.09*  --  1.02* 0.93 0.85  CALCIUM 7.2*   < > 7.0*   < > 7.4* 7.4* 7.6*  --  7.8* 8.2* 7.9*  MG 2.5*  --  2.5*  --  2.5*  --  2.5*  --   --  2.7*  --   PHOS 7.6*   < > 3.9   < > 2.8  2.8 2.4* 2.0*  --  2.2* 2.3* 3.2   < > = values in this interval not displayed.   Estimated Creatinine Clearance: 62.2 mL/min (by C-G formula based on SCr of 0.85 mg/dL).   LIVER Recent Labs  Lab  11/30/20 0449 12/01/20 1630 12/03/20 0621 12/03/20 1623 12/04/20 0429 12/04/20 1621 12/05/20 0353 12/05/20 1606  AST 109*  --   --   --   --   --   --   --   ALT 136*  --   --   --   --   --   --   --   ALKPHOS 115  --   --   --   --   --   --   --   BILITOT 0.5  --   --   --   --   --   --   --    PROT 4.3*  --   --   --   --   --   --   --   ALBUMIN 1.6*   < >  --  1.9* 1.9* 1.9* 2.0* 2.0*  INR  --   --  1.2  --   --   --   --   --    < > = values in this interval not displayed.     INFECTIOUS No results for input(s): LATICACIDVEN, PROCALCITON in the last 168 hours.   ENDOCRINE CBG (last 3)  Recent Labs    12/05/20 2347 12/06/20 0341 12/06/20 0746  GLUCAP 151* 112* 122*         IMAGING x48h  - image(s) personally visualized  -   highlighted in bold MR BRAIN WO CONTRAST  Result Date: 12/05/2020 CLINICAL DATA:  Coronavirus infection. Cardiac arrest. Cephalopathy. Anoxic brain injury. EXAM: MRI HEAD WITHOUT CONTRAST TECHNIQUE: Multiplanar, multiecho pulse sequences of the brain and surrounding structures were obtained without intravenous contrast. COMPARISON:  11/29/2020 FINDINGS: Brain: There are scattered areas of acute infarction affecting both cerebellar hemispheres. No large confluent infarction. No posterior fossa swelling. No brainstem insult. There is confluent cortical infarctions throughout the left middle cerebral artery territory. Areas of infarction may be a differing ages, with older infarction present in the posterior temporal/parietal junction region with some swelling and petechial blood products. Probable hyperacute cortical infarction evident elsewhere throughout the left MCA territory. Other scattered foci of acute infarction throughout both hemispheres either consistent with micro embolic infarctions or vasculitis related infarctions. Low level ischemic changes also present in the basal ganglia corpus striatum portions bilaterally. No midline shift. No hydrocephalus. No extra-axial collection. Vascular: Major vessels at the base of the brain show flow. Skull and upper cervical spine: Negative Sinuses/Orbits: Clear/normal Other: None IMPRESSION: Numerous scattered focal areas of acute infarction throughout both cerebral hemispheres and both cerebellar  hemispheres. Confluent infarction of the cortex throughout the left MCA territory, with areas of infarction may be a differing ages, with acute but older infarction in the left posterior temporal/parietal junction region with some swelling and petechial blood products. Probable hyperacute cortical infarction elsewhere throughout the left MCA territory. Low level ischemic changes also present in the basal ganglia in the corpus striatum portions bilaterally. Etiology of the infarctions could be embolic or related to vasculitic occlusions. There could also obviously be an element of global hypoxic ischemic insult. Electronically Signed   By: Nelson Chimes M.D.   On: 12/05/2020 13:12

## 2020-12-07 ENCOUNTER — Inpatient Hospital Stay (HOSPITAL_COMMUNITY): Payer: BLUE CROSS/BLUE SHIELD

## 2020-12-07 LAB — CBC
HCT: 22.1 % — ABNORMAL LOW (ref 36.0–46.0)
Hemoglobin: 7.3 g/dL — ABNORMAL LOW (ref 12.0–15.0)
MCH: 31.3 pg (ref 26.0–34.0)
MCHC: 33 g/dL (ref 30.0–36.0)
MCV: 94.8 fL (ref 80.0–100.0)
Platelets: 206 10*3/uL (ref 150–400)
RBC: 2.33 MIL/uL — ABNORMAL LOW (ref 3.87–5.11)
RDW: 16.9 % — ABNORMAL HIGH (ref 11.5–15.5)
WBC: 6 10*3/uL (ref 4.0–10.5)
nRBC: 16 % — ABNORMAL HIGH (ref 0.0–0.2)

## 2020-12-07 LAB — RENAL FUNCTION PANEL
Albumin: 2.2 g/dL — ABNORMAL LOW (ref 3.5–5.0)
Anion gap: 13 (ref 5–15)
BUN: 30 mg/dL — ABNORMAL HIGH (ref 6–20)
CO2: 23 mmol/L (ref 22–32)
Calcium: 8.7 mg/dL — ABNORMAL LOW (ref 8.9–10.3)
Chloride: 100 mmol/L (ref 98–111)
Creatinine, Ser: 0.86 mg/dL (ref 0.44–1.00)
GFR, Estimated: >60 mL/min (ref >60)
Glucose, Bld: 190 mg/dL — ABNORMAL HIGH (ref 70–99)
Phosphorus: 3.3 mg/dL (ref 2.5–4.6)
Potassium: 5.2 mmol/L — ABNORMAL HIGH (ref 3.5–5.1)
Sodium: 136 mmol/L (ref 135–145)

## 2020-12-07 LAB — GLUCOSE, CAPILLARY
Glucose-Capillary: 163 mg/dL — ABNORMAL HIGH (ref 70–99)
Glucose-Capillary: 154 mg/dL — ABNORMAL HIGH (ref 70–99)
Glucose-Capillary: 170 mg/dL — ABNORMAL HIGH (ref 70–99)

## 2020-12-07 LAB — POCT ACTIVATED CLOTTING TIME
Activated Clotting Time: 207 seconds
Activated Clotting Time: 214 seconds
Activated Clotting Time: 213 seconds

## 2020-12-07 LAB — APTT: aPTT: 131 seconds — ABNORMAL HIGH (ref 24–36)

## 2020-12-07 LAB — MAGNESIUM: Magnesium: 2.8 mg/dL — ABNORMAL HIGH (ref 1.7–2.4)

## 2020-12-07 MED ORDER — MIDAZOLAM 50MG/50ML (1MG/ML) PREMIX INFUSION
0.5000 mg/h | INTRAVENOUS | Status: DC
  Administered 2020-12-07: 2 mg/h via INTRAVENOUS
  Filled 2020-12-07: qty 50

## 2020-12-07 MED ORDER — MIDAZOLAM HCL 2 MG/2ML IJ SOLN: 1.0000 mg | INTRAMUSCULAR | Status: DC | PRN

## 2020-12-07 MED ORDER — MIDAZOLAM BOLUS VIA INFUSION (WITHDRAWAL LIFE SUSTAINING TX)
2.0000 mg | INTRAVENOUS | Status: DC | PRN
Start: 2020-12-07 — End: 2020-12-08
  Filled 2020-12-07: qty 2

## 2020-12-07 MED ORDER — MIDAZOLAM HCL 2 MG/2ML IJ SOLN
1.0000 mg | INTRAMUSCULAR | Status: DC | PRN
Start: 2020-12-07 — End: 2020-12-08

## 2020-12-07 MED ORDER — DEXTROSE 5 % IV SOLN: INTRAVENOUS | Status: DC

## 2020-12-07 MED ORDER — GLYCOPYRROLATE 0.2 MG/ML IJ SOLN: 0.2000 mg | INTRAMUSCULAR | Status: DC | PRN

## 2020-12-07 MED ORDER — FENTANYL CITRATE (PF) 100 MCG/2ML IJ SOLN: 25.0000 ug | INTRAMUSCULAR | Status: DC | PRN

## 2020-12-07 MED ORDER — GLYCOPYRROLATE 1 MG PO TABS: 1.0000 mg | ORAL_TABLET | ORAL | Status: DC | PRN

## 2020-12-07 MED ORDER — ACETAMINOPHEN 325 MG PO TABS: 650.0000 mg | ORAL_TABLET | Freq: Four times a day (QID) | ORAL | Status: DC | PRN

## 2020-12-07 MED ORDER — MIDAZOLAM 50MG/50ML (1MG/ML) PREMIX INFUSION: 0.0000 mg/h | INTRAVENOUS | Status: DC

## 2020-12-07 MED ORDER — ACETAMINOPHEN 650 MG RE SUPP: 650.0000 mg | Freq: Four times a day (QID) | RECTAL | Status: DC | PRN

## 2020-12-07 MED ORDER — DIPHENHYDRAMINE HCL 50 MG/ML IJ SOLN: 25.0000 mg | INTRAMUSCULAR | Status: DC | PRN

## 2020-12-07 MED ORDER — SODIUM CHLORIDE 0.9% FLUSH: 10.0000 mL | Freq: Two times a day (BID) | INTRAVENOUS | Status: DC

## 2020-12-07 MED ORDER — ALBUTEROL SULFATE (2.5 MG/3ML) 0.083% IN NEBU: 2.5000 mg | INHALATION_SOLUTION | RESPIRATORY_TRACT | Status: DC | PRN

## 2020-12-07 MED ORDER — SODIUM CHLORIDE 0.9% FLUSH: 10.0000 mL | INTRAVENOUS | Status: DC | PRN

## 2020-12-07 MED ORDER — MIDAZOLAM HCL 2 MG/2ML IJ SOLN
INTRAMUSCULAR | Status: AC
  Administered 2020-12-07: 2 mg via INTRAVENOUS
  Filled 2020-12-07: qty 2

## 2020-12-07 MED ORDER — MIDAZOLAM HCL 2 MG/2ML IJ SOLN: 2.0000 mg | INTRAMUSCULAR | Status: DC | PRN

## 2020-12-07 MED ORDER — POLYVINYL ALCOHOL 1.4 % OP SOLN
1.0000 [drp] | Freq: Four times a day (QID) | OPHTHALMIC | Status: DC | PRN
  Filled 2020-12-07: qty 15

## 2020-12-18 NOTE — Progress Notes (Signed)
Pt transitioned to comfort care.  Extubated at 2030 and pt expired at 2040.  Family notified.  CDS called.  Pt not a candidate for organ donation.

## 2020-12-18 NOTE — Progress Notes (Signed)
NAME:  Abigail [name], MRN:  [id number], DOB:  November 01, 1961, LOS: 19 ADMISSION DATE:  12/04/2020, CONSULTATION DATE:  11/19/2020 REFERRING MD:  [place]-ED, CHIEF COMPLAINT:  Hypoxic respiratory failure  Brief History   60 yo female former smoker with dyspnea and fatigue for 2 weeks prior to admission.  Her husband tested positive for COVID 19 infection.  In ER she had hypoxia with SpO2 in 70's and lactic acidosis.  She then developed PEA cardiac arrest from respiratory failure, ROSC after 8 minutes, and required intubation.  Past Medical History  DM Uterine cancer Bipolar HLD NSTEMI  Significant Hospital Events   12/11 Admit, PEA in ER 12/13 d/c heparin gtt 12/14 renal function worse, nephrology consulted.  Started Lasix, albumin trial 12/15 Started CRRT, off pressors 12/19 - Continues on CRRT, net negative -140/hour. Deeply sedated.  xxxxxxxxxxxxxxxxxxxxxxx 12/20 -remains on ventilator and CRRT.  MRI brain yesterday with evidence of hypoxemic ischemic injury.  Currently on fentanyl infusion,, scheduled oxycodone.  According to the bedside nurse: Family is requested a my chart review by one of their f friends who is a Dietitian before making a decision on terminal wean.   Currently full medical care but no CPR  Consults:  Palliative care Nephrology  Procedures:  ETT 12/11 >> Aline 12/11 >> Rt HD cath 12/15 >>  Significant Diagnostic Tests:   CTA 12/11 >> No PE, bilateral dense GGO with dense consolidation in the lower lobes bilaterally  Echo 12/12 >> EF 35 to 40%, grade 2 DD, RVSP 39 mmHg  EEG 12/12 >> severe cerebral dysfunction with burst suppression v/e hypoxemia  CT head 12/13 >> no acute pathology  Micro Data:  12/11 COVID >> Positive 12/11 blood cultures >> negative 12/12 MRSA PCR >> negative  Antimicrobials:  Ceftriaxone 12/11 > 12/17 Azithromycin 12/11 > 12/17  Interim history/subjective:   12/21 - cuff leak this morning. > RT adanved tube and   Cuff leak resolved. PEr RN -> terminal wean later today in afternoon. On fent gtt. On CRRT  Objective   Blood pressure (!) 105/56, pulse 73, temperature (!) 96.3 F (35.7 C), temperature source Axillary, resp. rate (!) 25, height 5\' 2"  (1.575 m), weight 60.2 kg, SpO2 91 %.    Vent Mode: PCV FiO2 (%):  [50 %-60 %] 50 % Set Rate:  [15 bmp] 15 bmp PEEP:  [10 [id number] cmH20] 10 cmH20 Plateau Pressure:  [20 cmH20-24 cmH20] 22 cmH20   Intake/Output Summary (Last 24 hours) at 01-01-21 0806 Last data filed at 01-Jan-2021 0700 Gross per 24 hour  Intake 2243.66 ml  Output 3950 ml  Net -1706.34 ml   Filed Weights   12/03/20 0500 12/06/20 0458 01-Jan-2021 0303  Weight: 68 kg 61.4 kg 60.2 kg     General Appearance:  Looks criticall ill Head:  Normocephalic, without obvious abnormality, atraumatic Eyes:  PERRL - yes, conjunctiva/corneas - muddy     Ears:  Normal external ear canals, both ears Nose:  G tube - no Throat:  ETT TUBE - yes , OG tube - yes Neck:  Supple,  No enlargement/tenderness/nodules Lungs: Clear to auscultation bilaterally, Ventilator   Synchrony - yes 60% fio3 Heart:  S1 and S2 normal, no murmur, CVP - no.  Pressors - no Abdomen:  Soft, no masses, no organomegaly Genitalia / Rectal:  Not done Extremities:  Extremities- intact Skin:  ntact in exposed areas . Sacral area - not examined Neurologic:  Sedation - fent gt -> RASS - -4  Resolved Hospital  Problem list   Elevated transaminases likely due to sepsis. Septic shock from COVID infection.  Assessment & Plan:   Acute hypoxic respiratory failure from COVID 19 pneumonia with ARDS.    12/02/2020 - > does not meet criteria for SBT/Extubation in setting of Acute Respiratory Failure due to severe resp failure, renal failure  Plan PRVC Terminal wean later today  COVID-19 wth s/p CAP Rx ending 12/17  plan Dc  baricitinib day 9 (last day 12/25) - ternal wean later 11/28/2020  Prednisone to 40mg /day  PEA from  respiratory failure. Acute systolic/diastolic CHF. Hx of CAD, HLD.  -  Appears euvolemic  plan cardiology assessment when stable and if c/w goals of care On Crestor, aspirin  AKI from ATN. Non gap metabolic acidosis.  11/22/2020  - on CRRT  plan Continue CRRT till terminal wean bic tab  Acute metabolic encephalopathy from hypoxia, sepsis, renal failure. - CT head 12/13 unremarkable - EEG 12/12 and MRI 12/19 -c./w Anoxia - EEG witheld due to aim for comfort  Plan  - sedation    DM type 2 poorly controlled with steroid induced hyperglycemia.  Plan Levemir, tube feed coverage  Hx of hypothyroidism. Continue Synthroid  Anemia of critical illness.  12/16/2020 - no active bleed. hgb 7.1 ? Got PRBC 12/18  Plan - PRBC for hgb </= 6.9gm%    - exceptions are   -  if ACS susepcted/confirmed then transfuse for hgb </= 8.0gm%,  or    -  active bleeding with hemodynamic instability, then transfuse regardless of hemoglobin value   At at all times try to transfuse 1 unit prbc as possible with exception of active hemorrhage     Best practice  Diet: tube feeds DVT prophylaxis: SQ heparin GI prophylaxis: pantoprazole Mobility: bedrest Code Status: DNR. Aim for comfort care later 12/03/2020  Disposition: ICU   GOALS of care   Family: dtr Abigail [name] 12/20 - [phone] -> denied questions. Dtr is aware of MRI results c/w anoxia. Says today is Anna's 92 year old niece's birtday so they wanted to wait for 1 more day but visit 11/24/2020 before decisions on comfort. Per daughter family and they are in agreement for comfort care but want to do 11/23/2020. Daughter in tears as she is talking to MD. Explained comfort process  TERMINAL WEAN DISCUSSION 12/21  - family  Explained concept of terminal wean  Explained how terminal wean works at the bedside Explained MD, RN, and RT role in this Explained that we are only stopping medicines and therapies  that are not effective and  are only prolonging her suffering Explained that vent and pressors are not consistent with her goals Explained that we are still caring for patient by providing care aimed at comfort, agony, pain, distress  Explained post terminal wean we allow nature to take course    Explain predicting death post terminal is unpredictable but in patient Abigail [name] with 09-11-1961 and Abigail [name] [id number] . However, likely to happen in order of seconds and minutes  Explained they can time the event based on personal needs and family/friends to gather  Explained that medicines used for comfort are morphine and benzo and explained the doctrine of double effect  They are appreciative and will time terminal wean later on 11/20/2020  after family gather  xxx Called daughter 12/14/2020 Candelaria Celeste - 12/21 - [place]   The patient Abigail [name] is critically  ill with multiple organ systems failure and requires high complexity decision making for assessment and support, frequent evaluation and titration of therapies, application of advanced monitoring technologies and extensive interpretation of multiple databases.   Critical Care Time devoted to patient care services described in this note is  35  Minutes. This time reflects time of care of this signee Dr Brand Males. This critical care time does not reflect procedure time, or teaching time or supervisory time of PA/NP/Med student/Med Resident etc but could involve care discussion time     Dr. Brand Males, M.D., [facility].C.P Pulmonary and Critical Care Medicine Staff Physician [place] [facility] Pager: [phone], If no answer or between  15:00h - 7:00h: call [phone]  2020-12-08 8:41 AM     LABS    PULMONARY Recent Labs  Lab 11/30/20 1030 11/30/20 1421 12/01/20 0511 12/02/20 0213 12/03/20 0333 12/04/20 1612 12/05/20 0449  PHART  7.271* 7.273* 7.233*  --  7.345* 7.462* 7.383  PCO2ART 36.1 36.6 37.3  --  49.2* 40.8 43.9  PO2ART 63* 99 140*  --  93 67* 61.5*  HCO3 16.7* 16.9* 15.2* 25.8 26.8 29.0* 25.6  TCO2 18* 18*  --  27 28 30   --   O2SAT 89.0 97.0 97.9 53.0 97.0 94.0 90.3    CBC Recent Labs  Lab 12/03/20 0442 12/03/20 0621 12/04/20 1612 12/04/20 1621 12-08-2020 0252  HGB 7.5*  --  6.8* 7.1* 7.3*  HCT 21.5*  --  20.0* 21.6* 22.1*  WBC 3.7*  --   --  8.4 6.0  PLT 136* 142*  --  214 206    COAGULATION Recent Labs  Lab 12/03/20 0621  INR 1.2    CARDIAC  No results for input(s): TROPONINI in the last 168 hours. No results for input(s): PROBNP in the last 168 hours.   CHEMISTRY Recent Labs  Lab 12/03/20 0442 12/03/20 1623 12/04/20 0429 12/04/20 1612 12/05/20 0353 12/05/20 1606 12/06/20 0919 12/06/20 1707 12/08/2020 0252  NA 136   < > 137   < > 137 136 137 135 136  K 4.8   < > 4.9   < > 4.7 4.9 5.0 5.0 5.2*  CL 100   < > 101   < > 101 100 101 99 100  CO2 24   < > 26   < > 27 24 24 24 23   GLUCOSE 199*   < > 198*   < > 106* 210* 140* 188* 190*  BUN 33*   < > 26*   < > 27* 30* 26* 27* 30*  CREATININE 1.23*   < > 1.09*   < > 0.93 0.85 0.82 0.86 0.86  CALCIUM 7.4*   < > 7.6*   < > 8.2* 7.9* 8.3* 8.2* 8.7*  MG 2.5*  --  2.5*  --  2.7*  --  2.6*  --  2.8*  PHOS 2.8  2.8   < > 2.0*   < > 2.3* 3.2 2.9 2.9 3.3   < > = values in this interval not displayed.   Estimated Creatinine Clearance: 60.9 mL/min (by C-G formula based on SCr of 0.86 mg/dL).   LIVER Recent Labs  Lab 12/03/20 0621 12/03/20 1623 12/05/20 0353 12/05/20 1606 12/06/20 0919 12/06/20 1707 2020/12/08 0252  ALBUMIN  --    < > 2.0* 2.0* 2.0* 1.9* 2.2*  INR 1.2  --   --   --   --   --   --    < > =  values in this interval not displayed.     INFECTIOUS No results for input(s): LATICACIDVEN, PROCALCITON in the last 168 hours.   ENDOCRINE CBG (last 3)  Recent Labs    12/06/20 2316 12/08/20 0354 08-Dec-2020 0730  GLUCAP  213* 163* 154*         IMAGING x48h  - image(s) personally visualized  -   highlighted in bold MR BRAIN WO CONTRAST  Result Date: 12/05/2020 CLINICAL DATA:  Coronavirus infection. Cardiac arrest. Cephalopathy. Anoxic brain injury. EXAM: MRI HEAD WITHOUT CONTRAST TECHNIQUE: Multiplanar, multiecho pulse sequences of the brain and surrounding structures were obtained without intravenous contrast. COMPARISON:  11/29/2020 FINDINGS: Brain: There are scattered areas of acute infarction affecting both cerebellar hemispheres. No large confluent infarction. No posterior fossa swelling. No brainstem insult. There is confluent cortical infarctions throughout the left middle cerebral artery territory. Areas of infarction may be a differing ages, with older infarction present in the posterior temporal/parietal junction region with some swelling and petechial blood products. Probable hyperacute cortical infarction evident elsewhere throughout the left MCA territory. Other scattered foci of acute infarction throughout both hemispheres either consistent with micro embolic infarctions or vasculitis related infarctions. Low level ischemic changes also present in the basal ganglia corpus striatum portions bilaterally. No midline shift. No hydrocephalus. No extra-axial collection. Vascular: Major vessels at the base of the brain show flow. Skull and upper cervical spine: Negative Sinuses/Orbits: Clear/normal Other: None IMPRESSION: Numerous scattered focal areas of acute infarction throughout both cerebral hemispheres and both cerebellar hemispheres. Confluent infarction of the cortex throughout the left MCA territory, with areas of infarction may be a differing ages, with acute but older infarction in the left posterior temporal/parietal junction region with some swelling and petechial blood products. Probable hyperacute cortical infarction elsewhere throughout the left MCA territory. Low level ischemic changes also present  in the basal ganglia in the corpus striatum portions bilaterally. Etiology of the infarctions could be embolic or related to vasculitic occlusions. There could also obviously be an element of global hypoxic ischemic insult. Electronically Signed   By: Nelson Chimes M.D.   On: 12/05/2020 13:12   DG CHEST PORT 1 VIEW  Result Date: 12-08-2020 CLINICAL DATA:  Intubation.  COVID positive. EXAM: PORTABLE CHEST 1 VIEW COMPARISON:  Prior study 2020/12/08. FINDINGS: Endotracheal tube and NG tube in unchanged position. Left IJ dual-lumen catheter in unchanged position with tip over the right atrium. Heart size stable. Diffuse bilateral pulmonary infiltrates/edema again noted. Tiny left pleural effusion cannot be excluded. No pneumothorax. Calcified densities right upper quadrant, possibly a calcified hepatic granulomas. Degenerative changes scoliosis thoracic spine. IMPRESSION: 1. Lines and tubes in unchanged position. 2. Diffuse bilateral pulmonary infiltrates/edema again noted. Similar findings on prior exam. Tiny left pleural effusion cannot be excluded. Electronically Signed   By: Marcello Moores  Register   On: 12/08/2020 07:23   DG CHEST PORT 1 VIEW  Result Date: 08-Dec-2020 CLINICAL DATA:  Intubation. EXAM: PORTABLE CHEST 1 VIEW COMPARISON:  12/03/2020. FINDINGS: Endotracheal tube, NG tube in stable position. Left IJ line in unchanged position with tip over right atrium. Heart size stable. Low lung volumes. Diffuse bilateral pulmonary infiltrates/edema again noted without interim change. No pleural effusion or pneumothorax. IMPRESSION: 1. Endotracheal tube, NG tube in stable position. Left IJ line in unchanged position with tip over right atrium. 2. Low lung volumes. Diffuse bilateral pulmonary infiltrates/edema again noted without interim change. Electronically Signed   By: Marcello Moores  Register   On: 12-08-2020 05:23

## 2020-12-18 NOTE — Progress Notes (Signed)
Daughter said they will be around 6.30pm after dinner for terminal wean     SIGNATURE    Dr. Brand Males, M.D., F.C.C.P,  Pulmonary and Critical Care Medicine Staff Physician, [place] Director - Interstitial Lung Disease  Program  Pulmonary [place] at [place], [place], [address]  Pager: [phone], If no answer  OR between  19:00-7:00h: page [phone] Telephone (clinical office): [phone] Telephone (research): [phone]  9:44 AM 12/31/2020

## 2020-12-18 NOTE — Progress Notes (Signed)
Assisted tele visit to patient with family member.  Isabel Ardila P, RN  

## 2020-12-18 NOTE — Progress Notes (Signed)
Patient ID: Abigail [name], female   DOB: 21-Jun-1961, 60 y.o.   MRN: [id number] S: No events overnight.  Family planning on coming in today at 6:30 pm for terminal wean O:BP (!) 138/55 (BP Location: Left Arm)   Pulse 84   Temp (!) 96.6 F (35.9 C) (Axillary)   Resp (!) 38   Ht _0  (1.575 m)   Wt 60.2 kg   SpO2 (!) 89%   BMI 24.27 kg/m   Intake/Output Summary (Last 24 hours) at 12-11-2020 1206 Last data filed at 12/11/20 1200 Gross per 24 hour  Intake 1976.91 ml  Output 3846 ml  Net -1869.09 ml   Intake/Output: I/O last 3 completed shifts: In: 3336.2 [I.V.:709.2; NG/GT:2627] Out: 4010 [Other:5815]  Intake/Output this shift:  Total I/O In: 366.6 [I.V.:116.6; NG/GT:250] Out: 581 [Other:581] Weight change: -1.2 kg Physical exam: unable to complete due to COVID + status.  In order to preserve PPE equipment and to minimize exposure to providers.  Notes from other caregivers reviewed   Recent Labs  Lab 12/04/20 0429 12/04/20 1612 12/04/20 1621 12/05/20 0353 12/05/20 1606 12/06/20 0919 12/06/20 1707 12/11/2020 0252  NA 137 134* 135 137 136 137 135 136  K 4.9 5.2* 5.1 4.7 4.9 5.0 5.0 5.2*  CL 101  --  99 101 100 101 99 100  CO2 26  --  _1 GLUCOSE 198*  --  223* 106* 210* 140* 188* 190*  BUN 26*  --  26* 27* 30* 26* 27* 30*  CREATININE 1.09*  --  1.02* 0.93 0.85 0.82 0.86 0.86  ALBUMIN 1.9*  --  1.9* 2.0* 2.0* 2.0* 1.9* 2.2*  CALCIUM 7.6*  --  7.8* 8.2* 7.9* 8.3* 8.2* 8.7*  PHOS 2.0*  --  2.2* 2.3* 3.2 2.9 2.9 3.3   Liver Function Tests: Recent Labs  Lab 12/06/20 0919 12/06/20 1707 December 11, 2020 0252  ALBUMIN 2.0* 1.9* 2.2*   No results for input(s): LIPASE, AMYLASE in the last 168 hours. No results for input(s): AMMONIA in the last 168 hours. CBC: Recent Labs  Lab 12/01/20 0446 12/02/20 0213 12/02/20 0310 12/03/20 0333 12/03/20 0442 12/03/20 0621 12/04/20 1612 12/04/20 1621 12/11/20 0252  WBC 3.8*  --  3.5*  --  3.7*  --   --  8.4  6.0  HGB 7.9*   < > 7.6*   < > 7.5*  --  6.8* 7.1* 7.3*  HCT 23.8*   < > 23.2*   < > 21.5*  --  20.0* 21.6* 22.1*  MCV 95.2  --  93.5  --  91.5  --   --  93.9 94.8  PLT 119*  --  118*  --  136* 142*  --  214 206   < > = values in this interval not displayed.   Cardiac Enzymes: No results for input(s): CKTOTAL, CKMB, CKMBINDEX, TROPONINI in the last 168 hours. CBG: Recent Labs  Lab 12/06/20 2001 12/06/20 2316 2020/12/11 0354 2020/12/11 0730 December 11, 2020 1119  GLUCAP 216* 213* 163* 154* 170*    Iron Studies: No results for input(s): IRON, TIBC, TRANSFERRIN, FERRITIN in the last 72 hours. Studies/Results: DG CHEST PORT 1 VIEW  Result Date: 12-11-2020 CLINICAL DATA:  Intubation.  COVID positive. EXAM: PORTABLE CHEST 1 VIEW COMPARISON:  Prior study 11-Dec-2020. FINDINGS: Endotracheal tube and NG tube in unchanged position. Left IJ dual-lumen catheter in unchanged position with tip over the right atrium. Heart size stable. Diffuse bilateral pulmonary infiltrates/edema again noted. Tiny left  pleural effusion cannot be excluded. No pneumothorax. Calcified densities right upper quadrant, possibly a calcified hepatic granulomas. Degenerative changes scoliosis thoracic spine. IMPRESSION: 1. Lines and tubes in unchanged position. 2. Diffuse bilateral pulmonary infiltrates/edema again noted. Similar findings on prior exam. Tiny left pleural effusion cannot be excluded. Electronically Signed   By: Marcello Moores  Register   On: 26-Dec-2020 07:23   DG CHEST PORT 1 VIEW  Result Date: 12/26/2020 CLINICAL DATA:  Intubation. EXAM: PORTABLE CHEST 1 VIEW COMPARISON:  12/03/2020. FINDINGS: Endotracheal tube, NG tube in stable position. Left IJ line in unchanged position with tip over right atrium. Heart size stable. Low lung volumes. Diffuse bilateral pulmonary infiltrates/edema again noted without interim change. No pleural effusion or pneumothorax. IMPRESSION: 1. Endotracheal tube, NG tube in stable position. Left IJ line  in unchanged position with tip over right atrium. 2. Low lung volumes. Diffuse bilateral pulmonary infiltrates/edema again noted without interim change. Electronically Signed   By: Marcello Moores  Register   On: 2020/12/26 05:23   . aspirin  81 mg Per Tube Daily  . B-complex with vitamin C  1 tablet Per Tube Daily  . chlorhexidine gluconate (MEDLINE KIT)  15 mL Mouth Rinse BID  . Chlorhexidine Gluconate Cloth  6 each Topical Daily  . docusate  100 mg Per Tube BID  . feeding supplement (PROSource TF)  45 mL Per Tube BID  . levothyroxine  88 mcg Per Tube Daily  . mouth rinse  15 mL Mouth Rinse 10 times per day  . pantoprazole sodium  40 mg Per Tube Q24H  . polyethylene glycol  17 g Per Tube Daily  . predniSONE  40 mg Per Tube Daily  . sodium bicarbonate  1,300 mg Per Tube TID  . sodium chloride flush  10-40 mL Intracatheter Q12H    BMET    Component Value Date/Time   NA 136 December 26, 2020 0252   K 5.2 (H) 2020-12-26 0252   CL 100 Dec 26, 2020 0252   CO2 23 12/26/2020 0252   GLUCOSE 190 (H) December 26, 2020 0252   BUN 30 (H) December 26, 2020 0252   CREATININE 0.86 26-Dec-2020 0252   CREATININE 0.70 04/25/2016 1037   CALCIUM 8.7 (L) 12-26-20 0252   GFRNONAA >60 12/26/2020 0252   GFRAA >60 04/16/2016 0508   CBC    Component Value Date/Time   WBC 6.0 12/26/2020 0252   RBC 2.33 (L) Dec 26, 2020 0252   HGB 7.3 (L) 2020/12/26 0252   HCT 22.1 (L) 12/26/2020 0252   PLT 206 December 26, 2020 0252   MCV 94.8 12-26-20 0252   MCH 31.3 2020-12-26 0252   MCHC 33.0 26-Dec-2020 0252   RDW 16.9 (H) 2020-12-26 0252   LYMPHSABS 0.7 12/13/2020 1527   MONOABS 0.4 11/29/2020 1527   EOSABS 0.0 12/14/2020 1527   BASOSABS 0.0 11/18/2020 1527   Assessment/Plan:  1. Oliguric AKI- multifactorial with ATN from septic shock and PEA arrest.  She remains anuric. 1. Started on CRRT 12/01/20 2. All fluids 4K/2.5Ca:  Pre-filter 500 ml/hr, post filter 400 ml/hr, dialysate 1500 ml/hr  3. UF rate 50-100 ml/hr 4. Heparin for  anticoagulation 5. Will stop CRRT at this time as she is for terminal wean later today. 2. Metabolic acidosis- due to #1 3. Acute hypoxic respiratory failure with ARDS and sepsis- due to covid-19 pneumonia on azithromycin and ceftriaxone per PCCM.  Treated with baricitinib, prednisone. 4. Anemia of critical illness 5. Hypophosphatemia 6. Disposition- MRI reveals global ischemic insult with multiple acute strokes.  Dr. Chase Caller has discussed this with family  and plan to transition to comfort care today.  CRRT has been stopped and will sign off at this time.  Please call with any questions or concerns.   Donetta Potts, MD [employer] [phone]

## 2020-12-18 NOTE — Progress Notes (Addendum)
Tech spoke to RN will cal back if exam is needed

## 2020-12-18 NOTE — Progress Notes (Signed)
Patient terminally extubated per order.  RN at bedside no complications noted.

## 2020-12-18 NOTE — Plan of Care (Signed)
  Problem: Nutrition: Goal: Adequate nutrition will be maintained Outcome: Progressing   Problem: Education: Goal: Knowledge of General Education information will improve Description: Including pain rating scale, medication(s)/side effects and non-pharmacologic comfort measures Outcome: Not Progressing   Problem: Health Behavior/Discharge Planning: Goal: Ability to manage health-related needs will improve Outcome: Not Progressing   Problem: Activity: Goal: Risk for activity intolerance will decrease Outcome: Not Progressing   

## 2020-12-18 NOTE — Progress Notes (Signed)
   Called for cuff leak Family suppsoted to be here by 18.30 for terminal wean Eval - very little vent exchange but patient essentially doing a T Piece and maintaing sats  Called daughter - she said she can be here in 30 min for terminal wean  Will hold off on tube exchange but proceed to intiatite versed infusion and keep patient comfortable to current ongoing fent gtt   Additional 20 min ccm time     SIGNATURE    Dr. Brand Males, M.D., F.C.C.P,  Pulmonary and Critical Care Medicine Staff Physician, [place] Director - Interstitial Lung Disease  Program  Pulmonary [place] at [place], [place], [address]  Pager: [phone], If no answer  OR between  19:00-7:00h: page [phone]  [phone] Telephone (clinical office): [phone] Telephone (research): [phone]  6:02 PM 2020/12/19

## 2020-12-18 NOTE — Progress Notes (Signed)
Nutrition Brief Note  Chart reviewed. Plan for terminal wean tonight around 1830 No further nutrition interventions warranted at this time.  Please re-consult as needed.   Kerman Passey MS, RDN, LDN, CNSC Registered Dietitian III Clinical Nutrition RD Pager and On-Call Pager Number Located in [place]

## 2020-12-18 NOTE — Progress Notes (Addendum)
[name] Elink notified December 19, 2020 4:34 AM   Abigail [name] (nephrology) notified of pt K+ 12/19/2020 4:42 AM

## 2020-12-18 NOTE — Progress Notes (Signed)
eLink Physician-Brief Progress Note Patient Name: Abigail [name] DOB: May 27, 1961 MRN: [id number]   Date of Service  12-19-20  HPI/Events of Note  Nursing reports ETT cuff leak. May need ETT exchange.   eICU Interventions  Will request that the ground team evaluate the patient at bedside.      Intervention Category Major Interventions: Other:  Abigail [name] Dec 19, 2020, 6:40 AM

## 2020-12-18 NOTE — Discharge Summary (Signed)
DISCHARGE SUMMARY    Date of admit: 12/01/2020  2:29 PM Date of discharge: 12/14/2020  8:40 PM Length of Stay: 10 days  PCP is Wilfrid Lund, PA  CAUSE(S) OF DEATH  COVID-19   PROBLEM LIST Active Problems: COVID-19 wih acute respiratory failure and ARDS Acute renal failure   Cardiac arrest (HCC)   Encounter for central line placement Anemia of critical illness    SUMMARY VONCEIL UPSHUR was 60 y.o. patient  has a past medical history of Bipolar disorder (HCC), CAD S/P percutaneous coronary angioplasty (04/06/2016), Cancer (HCC), Diabetes mellitus without complication (HCC), Hypertriglyceridemia, and Non-STEMI (non-ST elevated myocardial infarction) (HCC) (03/2016).   has a past surgical history that includes Abdominal hysterectomy; Cardiac catheterization (N/A, 04/14/2016); Cardiac catheterization (N/A, 04/14/2016); and transthoracic echocardiogram (03/2016).   Admitted on 01-Dec-2020 with   60 yo female former smoker with dyspnea and fatigue for 2 weeks prior to admission.  Her husband tested positive for COVID 19 infection.  In ER she had hypoxia with SpO2 in 70's and lactic acidosis.  She then developed PEA cardiac arrest from respiratory failure, ROSC after 8 minutes, and required intubation.     Significant Hospital Events   12/11 Admit, PEA in ER 12/13 d/c heparin gtt 12/14 renal function worse, nephrology consulted.  Started Lasix, albumin trial 12/15 Started CRRT, off pressors 12/19 - Continues on CRRT, net negative -140/hour. Deeply sedated.   12/20 -remains on ventilator and CRRT.  MRI brain yesterday with evidence of hypoxemic ischemic injury.  Currently on fentanyl infusion,, scheduled oxycodone.  According to the bedside nurse: Family is requested a my chart review by one of their f friends who is a Research scientist (physical sciences) before making a decision on terminal wean.   Currently full medical care but no CPR   12/21 - cuff leak this morning. > RT  adanved tube and  Cuff leak resolved. PEr RN -> terminal wean later today in afternoon. On fent gtt. On CRRT. HAs ARDS  Later in evening of 12/05/2020 patient kept comfortable. Family visited her. Terminal wen performed and she passed away     SIGNATURE    Dr. Kalman Shan, M.D., F.C.C.P,  Pulmonary and Critical Care Medicine Staff Physician, [facility] Center Director - Interstitial Lung Disease  Program  Pulmonary Fibrosis [facility] at [facility] [place], [place], [address]  Pager: [phone], If no answer  OR between  19:00-7:00h: page [phone] Telephone (clinical office): [phone] Telephone (research): [phone]  7:39 AM 12/15/2020

## 2020-12-18 DEATH — deceased
